# Patient Record
Sex: Female | Born: 1990 | Race: White | Hispanic: No | Marital: Single | Smoking: Never smoker
Health system: Southern US, Community
[De-identification: ages and names within clinical notes are randomized; demographics above are authoritative.]

## PROBLEM LIST (undated history)

## (undated) DIAGNOSIS — R6250 Unspecified lack of expected normal physiological development in childhood: Secondary | ICD-10-CM

## (undated) DIAGNOSIS — Z8669 Personal history of other diseases of the nervous system and sense organs: Secondary | ICD-10-CM

## (undated) DIAGNOSIS — R519 Headache, unspecified: Secondary | ICD-10-CM

## (undated) DIAGNOSIS — R42 Dizziness and giddiness: Secondary | ICD-10-CM

## (undated) DIAGNOSIS — H9190 Unspecified hearing loss, unspecified ear: Secondary | ICD-10-CM

## (undated) DIAGNOSIS — J302 Other seasonal allergic rhinitis: Secondary | ICD-10-CM

## (undated) DIAGNOSIS — F419 Anxiety disorder, unspecified: Secondary | ICD-10-CM

## (undated) DIAGNOSIS — F909 Attention-deficit hyperactivity disorder, unspecified type: Secondary | ICD-10-CM

## (undated) DIAGNOSIS — R5600 Simple febrile convulsions: Secondary | ICD-10-CM

## (undated) DIAGNOSIS — R482 Apraxia: Secondary | ICD-10-CM

## (undated) DIAGNOSIS — Z9889 Other specified postprocedural states: Secondary | ICD-10-CM

## (undated) DIAGNOSIS — K219 Gastro-esophageal reflux disease without esophagitis: Secondary | ICD-10-CM

## (undated) DIAGNOSIS — I499 Cardiac arrhythmia, unspecified: Secondary | ICD-10-CM

## (undated) DIAGNOSIS — F79 Unspecified intellectual disabilities: Secondary | ICD-10-CM

## (undated) HISTORY — PX: OTHER SURGICAL HISTORY: SHX169

## (undated) HISTORY — DX: Unspecified hearing loss, unspecified ear: H91.90

---
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
## 2012-03-13 HISTORY — PX: OTHER SURGICAL HISTORY: SHX169

## 2012-04-23 HISTORY — PX: OTHER SURGICAL HISTORY: SHX169

## 2012-05-25 DIAGNOSIS — R488 Other symbolic dysfunctions: Secondary | ICD-10-CM

## 2012-05-25 DIAGNOSIS — R471 Dysarthria and anarthria: Secondary | ICD-10-CM | POA: Insufficient documentation

## 2012-05-25 DIAGNOSIS — F429 Obsessive-compulsive disorder, unspecified: Secondary | ICD-10-CM

## 2012-05-25 DIAGNOSIS — R279 Unspecified lack of coordination: Secondary | ICD-10-CM | POA: Insufficient documentation

## 2012-05-25 DIAGNOSIS — R482 Apraxia: Secondary | ICD-10-CM | POA: Insufficient documentation

## 2012-05-25 DIAGNOSIS — F988 Other specified behavioral and emotional disorders with onset usually occurring in childhood and adolescence: Secondary | ICD-10-CM

## 2012-05-31 ENCOUNTER — Encounter: Payer: Self-pay | Admitting: Pediatrics

## 2012-05-31 ENCOUNTER — Ambulatory Visit (INDEPENDENT_AMBULATORY_CARE_PROVIDER_SITE_OTHER): Payer: Managed Care, Other (non HMO) | Admitting: Pediatrics

## 2012-05-31 VITALS — BP 100/74 | HR 78 | Ht <= 58 in | Wt 112.2 lb

## 2012-05-31 DIAGNOSIS — F988 Other specified behavioral and emotional disorders with onset usually occurring in childhood and adolescence: Secondary | ICD-10-CM

## 2012-05-31 DIAGNOSIS — R279 Unspecified lack of coordination: Secondary | ICD-10-CM

## 2012-05-31 DIAGNOSIS — R482 Apraxia: Secondary | ICD-10-CM

## 2012-05-31 DIAGNOSIS — F70 Mild intellectual disabilities: Secondary | ICD-10-CM

## 2012-05-31 DIAGNOSIS — R488 Other symbolic dysfunctions: Secondary | ICD-10-CM

## 2012-05-31 DIAGNOSIS — R471 Dysarthria and anarthria: Secondary | ICD-10-CM

## 2012-05-31 NOTE — Patient Instructions (Addendum)
It was a pleasure to see you.  I recommended [facility] as a good provider for Mackenzie Knight.  We discussed a polysomnogram to evaluate her sleep.  I would recommend this to evaluate her snoring.  She does not have any obvious airway obstruction.  Because of her dysmenorrhea, she needs to have this evaluated.  This may be evaluated by primary physician.  She may need an obstetrician.  I will be happy to write any letters that are needed as you pursue permanent disability, and guardianship.

## 2012-05-31 NOTE — Progress Notes (Signed)
Patient: Mackenzie Knight MRN: [id number] Sex: female DOB: [birth year]  Provider: Deetta Perla, MD Location of Care: [facility]  Note type: Routine return visit  History of Present Illness: Referral Source: Dr. Angus Seller. Lowe History from: mother, patient and [facility] chart Chief Complaint: OCD/ADD  Mackenzie Knight is a 22 y.o. female who returns for evaluation of cognitive delays, motor apraxia, generalized fatigue, and inability to carry out sustained motor or cognitive tasks for possible permanent disability..  The patient returns today for the first time since December 13, 2009.  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  She has been followed by me since February 14, 1993, when she was less than six months of age.  At that time, she had significant problems with swallowing, failure to thrive, and fine and gross motor delay.  She is here today because her mother wants to file for permanent disability for her daughter.  She believes that the patient is not employable because of her cognitive limitations, fine and gross motor weakness, and her decreased stamina.  At 68, she also was unable to care for herself other than health skills.  She is not capable of gainful employment that would allow her to support herself, nor is she able to manage money.  She is not a guardian at this time.  Her mother has significant health problems.  This is further isolated [name], because they are not able to get out as much.  When Mackenzie Knight finished high school, there was no plan and limited opportunity for activities outside the home.  She basically sits at home and spends time on the Internet.  Unfortunately, she is very blunt with comments that she makes in social media, and this has created a number of problems.  She has obsessive and rigid traits when she gets an idea in her  head, she does not let go of it.  Her mother is able to be firm with her and does not always allow her, her way.  Her father has more difficulty.  She has nighttime rituals, which are helpful to get her to bed.  She is eating more independently, but has to eat things in a particular order.  Fortunately, these are not problematic.  She has multiple arousals at nighttime and never gets quality sleep.  Consequently, she is often tired.  Mother tells me that she snores.  She is not able to tell me if she has issues with apnea.  In the past, she had problems with severe headaches that seem to be caused by stress at home and at school.  She has had migrainous qualities.  Mother did not mention headaches as problem today.  The patient has about two weeks with premenstrual symptoms in a four day period.  The length of the cycles is 35 to 40 days.  In addition, given that she is 77, she is no longer seeing her pediatrician Dr. Gardiner Rhyme.  Mother is trying to find a primary physician who can provide care for her daughter.  Since her last visit, she had reconstruction surgery in her right ear drum and there is packing in the ear.  The patient has significant cognitive impairments.  She has a head that seems large for her very petite body.  EEG at 57 months of age was normal.  She has not had further neuroimaging nor a genetics workup.  Review of Systems: 12 system  review was remarkable for weight gain, fatigue, swelling in legs, hearing loss, itching, moles, constipation, easy bruising, joint pain, skin sensitivity, frequent infections, memory loss, confusion, weakness, dizziness, depression, anxiety, not enough sleep, decreased energy, insomnia, sleepiness and snoring. She has not gained weight in 2 years since I saw her.  She has chronic hearing loss from her multiple episodes of otitis media her skin itches because of eczema.  Her bowel movements are hard to occur daily.  She has pain in her left shoulder.  She  has issues with snoring and possibly apnea.  She is often lightheaded and has experienced near syncope.  Her mother thinks that she is depressed and anxious: this has not been diagnosed.  She is a restless sleeper and does not sleep for more than a few hours at a time.  Past Medical History  Diagnosis Date  . Hearing loss    Hospitalizations: yes, Head Injury: yes, Nervous System Infections: no, Immunizations up to date: yes Past Medical History Comments: Patient was hospitalized due to virus when she was 22 years old.  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 Dr. Rema Fendt at [facility] wants to schedual eardrum reconstuction surgery, she has had more hearing loss recently in the right ear.  Her highest developmental quotient as a toddler was 47. When last tested before age 59 it was 3. In March 1996 the patient had episodes of unresponsive staring. EEG at 18 months was normal in the waking state. She was evaluated by Dr. Ramonita Lab, child neurologist at [facility]. 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 No further workup was recommended. She has not had any imaging studies of her brain nor chromosomal studies.  She was given growth hormone at [facility] in an attempt to help her growth. Once off growth hormone she gained 8 pounds in 3 months. She had an irregular menstrual cycle.  She has had extensive physical occupational and speech therapy since she was a toddler. She has a prominent nose, slightly receding chin line, and short stature. As a young child she had severe dysarthria to the point of being unintelligible.  She had problems with mood, anxiety, difficulty sleeping, constipation, environmental allergies, and acne.  Surgical History Past Surgical History  Procedure Laterality Date  . Ear surgeries     Surgical History Comments: Surgery on  right ear to repair her eardrum April 23, 2012.  Family History family history includes Breast cancer in her maternal grandmother; Lupus in her mother; Other in her mother; and Scleroderma in her mother.  Patient's mother has autoimmune disease, symptoms of Lupus, Scleroderma Sjogrens, possible fibromyalgia.  She has recurrent bleeding in stomach and is undergoing treatment at [facility].  They feel that this disease is hereditary. (GAVE disease) She also has low body temp and fatigue. Family History is negative migraines, seizures, cognitive impairment, blindness, deafness, birth defects, chromosomal disorder, autism.  Social History History   Social History  . Marital Status: Single    Spouse Name: N/A    Number of Children: N/A  . Years of Education: N/A   Social History Main Topics  . Smoking status: Never Smoker   . Smokeless tobacco: Never Used  . Alcohol Use: No  . Drug Use: No  . Sexually Active: No   Other Topics Concern  . None   Social History Narrative  . None  Educational level junior college School Attending: [employer]   Living with mother  Hobbies/Interest: internet and texting School comments Aryel is taking a few classes at [employer]  No current outpatient prescriptions on file prior to visit.   No current facility-administered medications on file prior to visit.   The medication list was reviewed and reconciled. All changes or newly prescribed medications were explained.  A complete medication list was provided to the patient/caregiver.  Allergies  Allergen Reactions  . Sulfa Antibiotics    Physical Exam BP 100/74  Pulse 78  Ht 4' 8.75" (1.441 m)  Wt 112 lb 3.2 oz (50.894 kg)  BMI 24.51 kg/m2  General: alert, well developed, well nourished,  short stature in no acute distress  right-handed, blond hair, blue eyes Head: normocephalic out of proportion to body size, She has a prominent nose, slightly receding chin line Ears, Nose and Throat: Otoscopic: right  tympanic membrane was packed and not visible. The left has a scar from myringotomy tube placement .  Pharynx: oropharynx is pink without exudates or tonsillar hypertrophy. Neck: supple, full range of motion, no cranial or cervical bruits Respiratory: auscultation clear Cardiovascular: no murmurs, pulses are normal Musculoskeletal: no skeletal deformities or apparent scoliosis, she has ligamentous laxity in her large joints Skin: no rashes or neurocutaneous lesions  Neurologic Exam  Mental Status: alert; oriented to person, place, and year; knowledge is normal for age; language is normal; she has dysarthria but is intelligible Cranial Nerves: visual fields are full to double simultaneous stimuli; extraocular movements are full, I think that her right eye wanders; pupils are round reactive to light; funduscopic examination shows sharp disc margins with normal vessels; symmetric facial strength; midline tongue and uvula; air conduction is greater than bone conduction bilaterally. Motor: Normal strength, tone, and mass in her legs and; her weakness is in her wrists and intrinsic muscles in the hands 4/5   acceptable fine motor movements; no pronator drift. Sensory: intact responses to cold, vibration, proprioception and stereognosis  Coordination: good finger-to-nose, rapid repetitive alternating movements and finger apposition   Gait and Station:  slightly broad-based gait and station; patient is able to walk on heels, toes and tandem with mild difficulty; balance is  low normal; Romberg exam is negative; Gower response is negative Reflexes: symmetric and diminished bilaterally; no clonus; bilateral flexor plantar responses.  Assessment 1. Mild cognitive impairment 317. 2. Lack of coordination 781.3. 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 4. Dysarthria 784.5. 5. 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  Plan I told mother that I would help any way I could with her plans.  I strongly urged her to meet  with a lawyer to obtain guardianship over the patient.  We discussed various programs that exist throughout the city for young adults who we need to find out where to go for education and social activities.  I recommended that she speak with Barb Merino, RN who has links to the association for retarded citizens.   I do not think that she needs medical treatment at this time.  I am not certain that a detailed genetic and neurologic workup will be useful.  She has a nonfocal examination.  She could very well have some form of deletion that would be found on chromosomal microarray.  This would help explain her condition.  It would not change situation.  I spent 45 minutes of face-to-face time with mother and daughter, more than half of it in consultation.  Deetta Perla MD

## 2012-08-24 DIAGNOSIS — Z0289 Encounter for other administrative examinations: Secondary | ICD-10-CM

## 2012-09-01 DIAGNOSIS — H905 Unspecified sensorineural hearing loss: Secondary | ICD-10-CM | POA: Insufficient documentation

## 2012-11-01 ENCOUNTER — Encounter: Payer: Self-pay | Admitting: Podiatry

## 2012-11-01 ENCOUNTER — Ambulatory Visit (INDEPENDENT_AMBULATORY_CARE_PROVIDER_SITE_OTHER): Payer: Managed Care, Other (non HMO) | Admitting: Podiatry

## 2012-11-01 ENCOUNTER — Telehealth: Payer: Self-pay | Admitting: *Deleted

## 2012-11-01 VITALS — BP 102/72 | HR 77 | Resp 16 | Ht <= 58 in | Wt 114.0 lb

## 2012-11-01 DIAGNOSIS — L600 Ingrowing nail: Secondary | ICD-10-CM

## 2012-11-01 MED ORDER — HYDROCODONE-ACETAMINOPHEN 5-325 MG PO TABS: ORAL_TABLET | ORAL | Status: DC

## 2012-11-01 NOTE — Progress Notes (Signed)
Patient ID: Mackenzie Knight, female   DOB: [birth year], 22 y.o.   MRN: [id number] Subjective: This patient presents with father complaining of pain along the lateral margin of the left hallux toenail with a previous history of matricectomy x3. In the past several weeks her some history of drainage from the site.  Objective: The lateral margin of left hallux toenail is incurvated with slight erythema and edema and recurrence of the ingrowing lateral margin.  Assessment: Recurrence ingrowing lateral margin of left hallux toenail.  Plan: Offered father and patient revisional phenol matricectomy to the lateral margin of left hallux toenail. He verbally consented to the procedure. The left hallux was then blocked with 3 cc of 50-50 mixture of 2% plain Xylocaine and 0.5% plain Marcaine. The left hallux was painted with Betadine and exsanguinated. The lateral margin of left hallux toenail was excised and a phenol matricectomy was performed to the lateral margin of the left hallux toenail. An antibiotic compression dressing was applied. The tourniquet was released and spontaneous capillary filling times and noted to the digit of the left foot.  Postoperative oral and written instructions include Dilantin bacterial soft soaks, topical antibiotic ointment and a 1H Band-Aid to the wound site until healed. After discharge father requested pain medication. Rx'd Vicodin 5/325 #10 SIg: 1 by mouth every 6 hours when necessary pain.  Reappoint at the request of patient or parent.  Richard C.Leeanne Deed, DPM

## 2012-11-01 NOTE — Telephone Encounter (Signed)
Pt's ftr states they were unable to find the Vidodin from last visit, can Dr Leeanne Deed write another rx for about 8 or 10. Also how long will the numbness last and can she take a shower?  Dr Leeanne Deed ordered Vicodin 5/325 mg #10 1 tablet q 6 to 8 hrs, orders printed for pt's ftr to pick-up.  I informed pt's ftr that the numbness could last 4 to 6 hrs, and she could shower if she kept the toe dry until tomorrow's shower.

## 2012-11-01 NOTE — Patient Instructions (Addendum)
ANTIBACTERIAL SOAP INSTRUCTIONS  THE DAY AFTER PROCEDURE  Please follow the instructions your doctor has marked.   Shower as usual. Before getting out, place a drop of antibacterial liquid soap (Dial) on a wet, clean washcloth.  Gently wipe washcloth over affected area.  Afterward, rinse the area with warm water.  Blot the area dry with a soft cloth and cover with antibiotic ointment (neosporin, polysporin, bacitracin) and band aid or gauze and tape  Place 3-4 drops of antibacterial liquid soap in a quart of warm tap water.  Submerge foot into water for 20 minutes.  If bandage was applied after your procedure, leave on to allow for easy lift off, then remove and continue with soak for the remaining time.  Next, blot area dry with a soft cloth and cover with a bandage.  Apply other medications as directed by your doctor, such as cortisporin otic solution (eardrops) or neosporin antibiotic ointment     No PE this week. ( Week of 11/01/12)

## 2012-11-15 ENCOUNTER — Ambulatory Visit (INDEPENDENT_AMBULATORY_CARE_PROVIDER_SITE_OTHER): Payer: Managed Care, Other (non HMO) | Admitting: Podiatry

## 2012-11-15 ENCOUNTER — Encounter: Payer: Self-pay | Admitting: Podiatry

## 2012-11-15 VITALS — BP 111/73 | HR 85 | Resp 16 | Ht <= 58 in | Wt 114.0 lb

## 2012-11-15 DIAGNOSIS — L03039 Cellulitis of unspecified toe: Secondary | ICD-10-CM

## 2012-11-15 MED ORDER — CEPHALEXIN 500 MG PO CAPS: 500.0000 mg | ORAL_CAPSULE | Freq: Three times a day (TID) | ORAL | Status: DC

## 2012-11-15 NOTE — Patient Instructions (Signed)
Complete 7 days of cephalexin and if the toenail is not improved return for nail surgery.

## 2012-11-15 NOTE — Progress Notes (Signed)
  Subjective:    Patient ID: Mackenzie Knight, female    DOB: Jan 27, 1990, 22 y.o.   MRN: [id number] "It's the other side of that toe now."  She is complaining of pain along the medial border of the left hallux toenail. The lateral margin of the left hallux toenail was removed for permanent correction several times.   HPI Comments: N Throbbing  L  Ingrown Hallux left medial border D  Wednesday O  Suddenly C  About the same A  Pressure from my tennis shoe T  bandaid      Review of Systems     Objective:   Physical Exam 22 year old white female presents with her father.  Dermatological: The medial border of the left hallux toenail demonstrates mild incurvation and low-grade erythema slight edema no drainage. The lateral margin of the left hallux toenail was removed from previous phenol matricectomy.       Assessment & Plan:  Assessment: Low-grade paronychia medial border left hallux toenail  Plan: Cephalexin 500 mg by mouth 3 times a day x7 days prescribed. Patient and father advised that after completion of antibiotic if symptoms do not improve would excise the medial margin of the left hallux nail. Reappoint when necessary.  Richard C.Leeanne Deed, DPM

## 2012-11-23 ENCOUNTER — Telehealth: Payer: Self-pay | Admitting: *Deleted

## 2012-11-23 NOTE — Telephone Encounter (Signed)
Pt's ftr states that pt's antibiotic runs out today and 1st available appt is on 11/29/2012.  [name]'s nail area is still red and tender.  Does she need another refill?  I instructed pt's ftr to begin either Epsom salt, or betadine, white vinegar soaks and change antibiotic ointment to Bacitracin ointment only.  I would advise Dr Leeanne Deed of the situation and call again.

## 2012-11-24 NOTE — Telephone Encounter (Signed)
Dr Leeanne Deed ordered continue soaks and make an appt for toenail procedure.  Orders to pt's ftr and transferred to scheduler.

## 2012-11-24 NOTE — Telephone Encounter (Signed)
Maintain local soaking at this time. Schedule appointment to have the medial margin of the nail removed permanently.

## 2012-11-29 ENCOUNTER — Encounter: Payer: Self-pay | Admitting: Podiatry

## 2012-11-29 ENCOUNTER — Ambulatory Visit (INDEPENDENT_AMBULATORY_CARE_PROVIDER_SITE_OTHER): Payer: Managed Care, Other (non HMO) | Admitting: Podiatry

## 2012-11-29 VITALS — BP 111/76 | HR 87 | Resp 18

## 2012-11-29 DIAGNOSIS — L600 Ingrowing nail: Secondary | ICD-10-CM

## 2012-11-29 NOTE — Patient Instructions (Signed)

## 2012-11-29 NOTE — Progress Notes (Signed)
  Subjective:    Patient ID: Mackenzie Knight, female    DOB: August 25, 1990, 22 y.o.   MRN: [id number]  HPI ingrown toenail on left and is still sore and tender and no draining and hurts to wear shoes and used neosporin and did soak it  This patient presents with her father after completing a dose of cephalexin without any resolution of symptoms in the left medial margin of the hallux.  Review of Systems     Objective:   Physical Exam The medial margin of the left hallux is incurvated with low-grade edema and erythema, no drainage noted.       Assessment & Plan:   Assessment: Ingrowing medial margin of the left hallux toenail with low-grade paronychia.  Plan: Offered father and patient phenol matricectomy to the medial margin of the left hallux toenail. The father and patient verbally except this procedure. The left hallux was then blocked with 3 cc of 50-50 mixture of 2% plain Xylocaine and 0.5% plain Marcaine. The left hallux is prepped with Betadine and exsanguinated. The medial border of the left hallux toenail was excised and a phenol matricectomy was performed of the medial border of the left hallux toenail. An antibiotic compression dressing was applied. The tourniquet was released and spontaneous capillary filling time noted to the left hallux. Patient will begin antibacterial soft soaks and application topical antibiotic ointment to wound site daily until healed.  Reappoint at patient's request.

## 2012-11-30 IMAGING — CT CT TEMPORAL BONES W/O CM
4 of 6 series · 18 of 30 positions shown, 19 images · non-contrast
Comparison: None.

CLINICAL DATA: Acute serous otitis  media on the right.

CT TEMPORAL BONES WITHOUT CONTRAST
TECHNIQUE: Axial and coronal plane CT imaging of the petrous
temporal bones was performed with thin-collimation image
reconstruction.  No intravenous contrast was administered.
Multiplanar CT image reconstructions were also generated.

[Series 3: ax mag right · axial · 0.20mm/px · z∈[+117,+145]mm · 3 of 179 slices shown]
[im 45/179  bone]
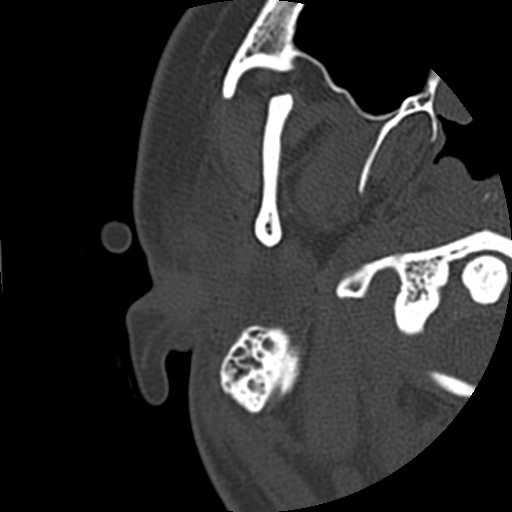
[im 90/179  bone]
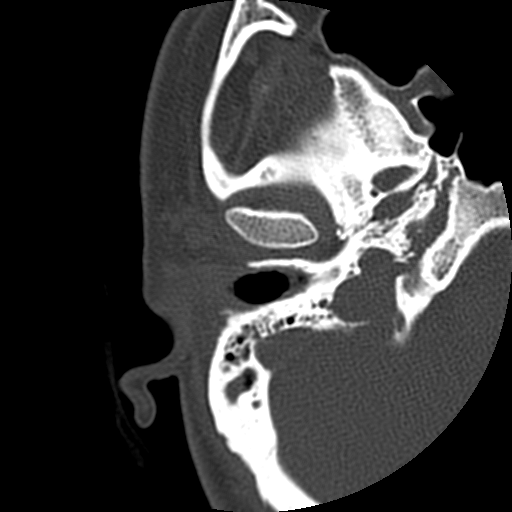
[im 134/179  bone]
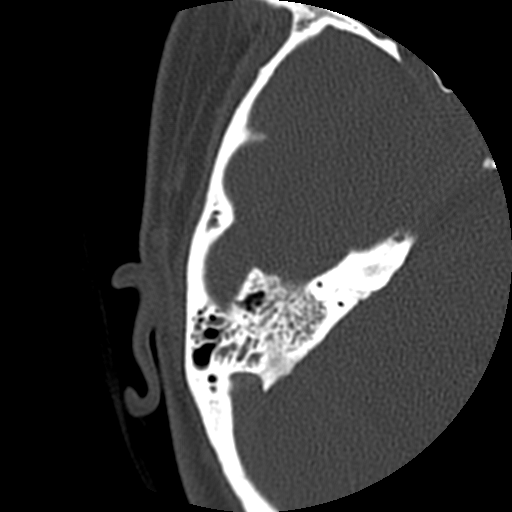

[Series 200: cor · axial · 0.33mm/px · z∈[+105,+216]mm · 3 of 95 slices shown, 4 images]
[im 1/95  brain]
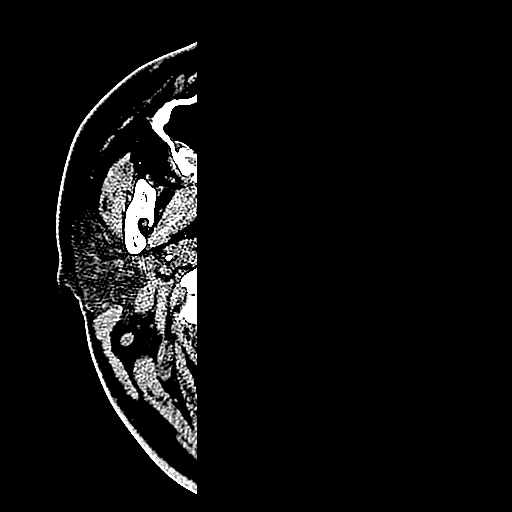
[im 1/95  bone]
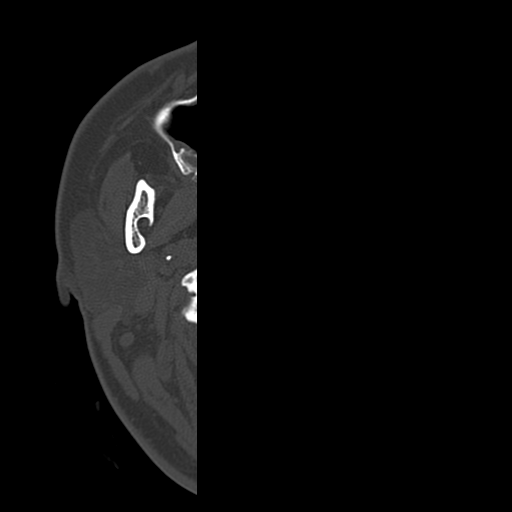
[im 48/95  bone]
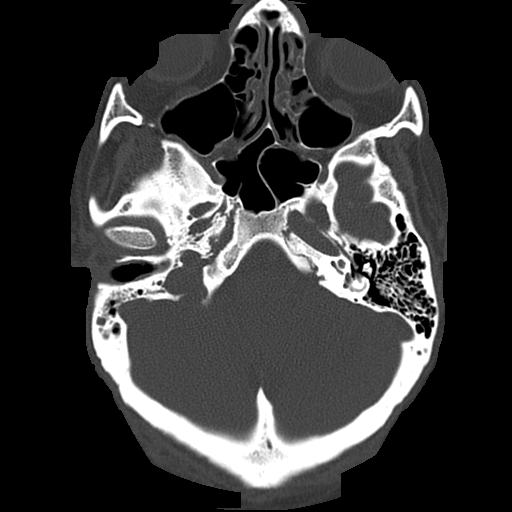
[im 95/95  bone]
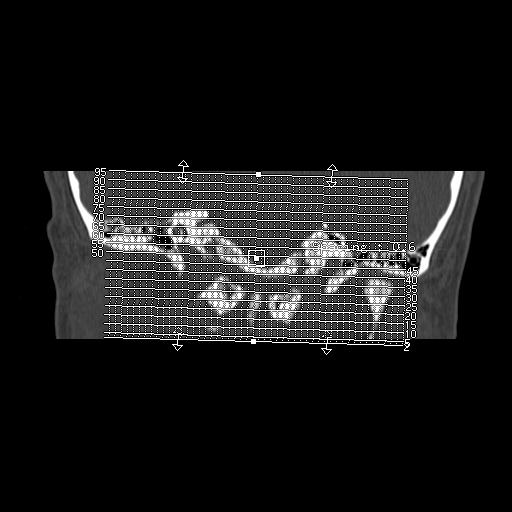

[Series 300: rt cor · coronal · 0.20mm/px · 6 of 261 slices shown]
[im 38/261  bone]
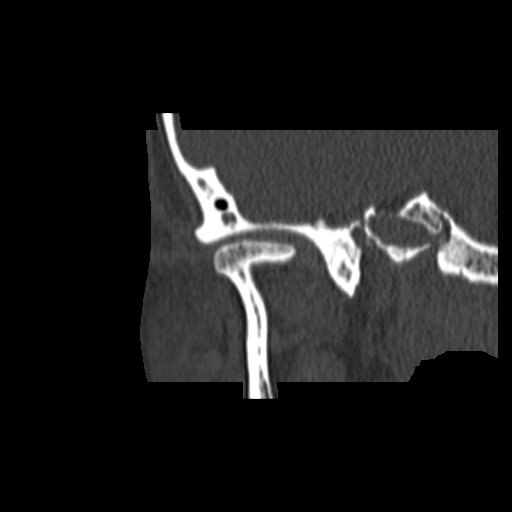
[im 75/261  bone]
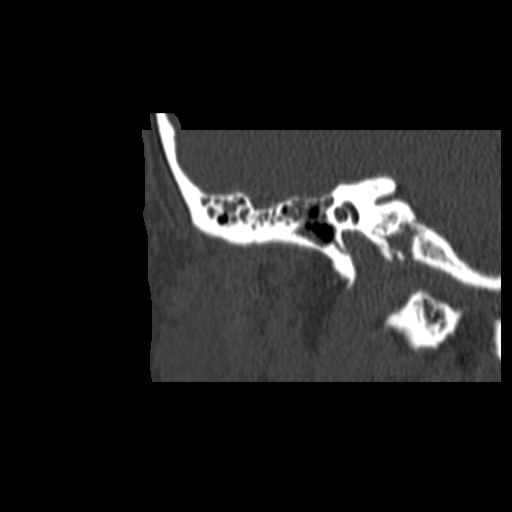
[im 112/261  bone]
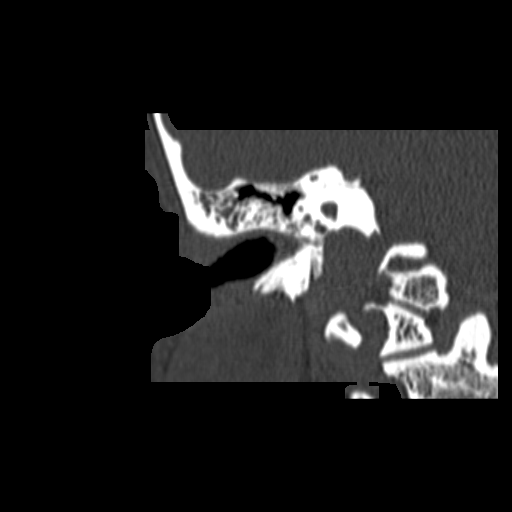
[im 149/261  bone]
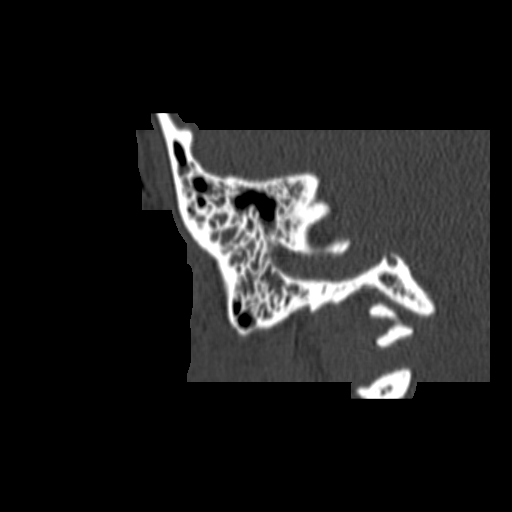
[im 186/261  bone]
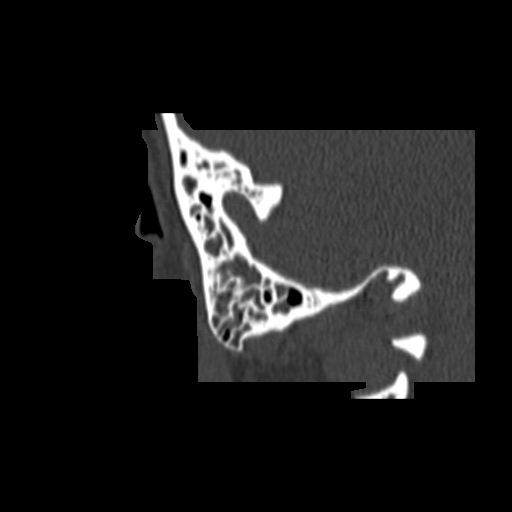
[im 223/261  bone]
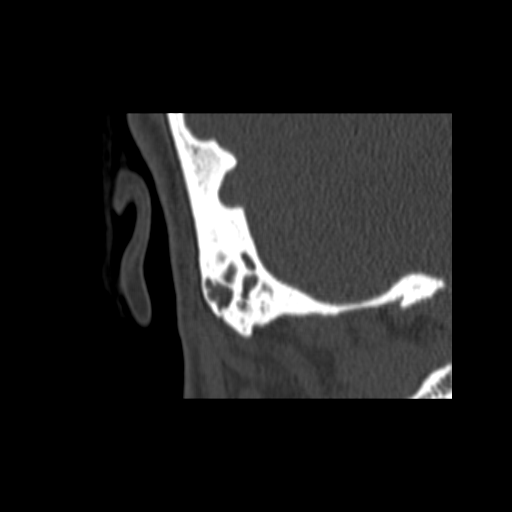

[Series 400: lt cor · coronal · 0.20mm/px · 6 of 286 slices shown]
[im 41/286  bone]
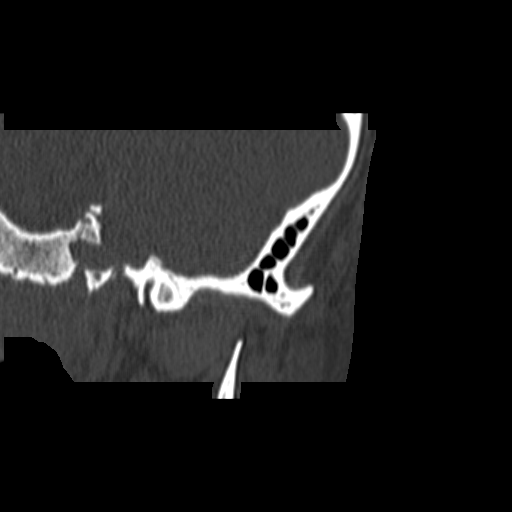
[im 82/286  bone]
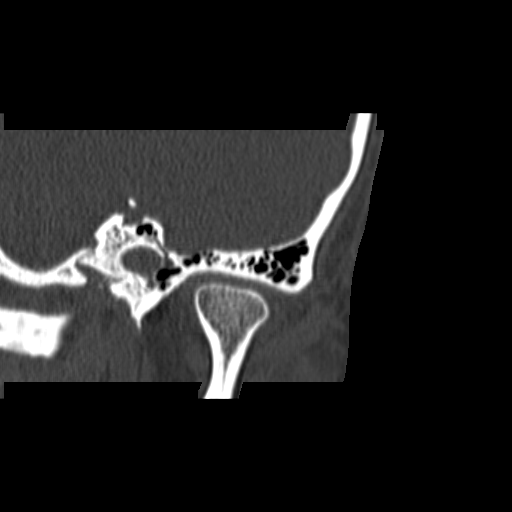
[im 123/286  bone]
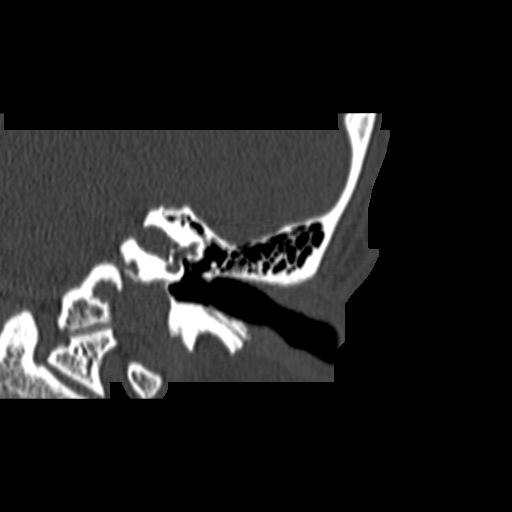
[im 163/286  bone]
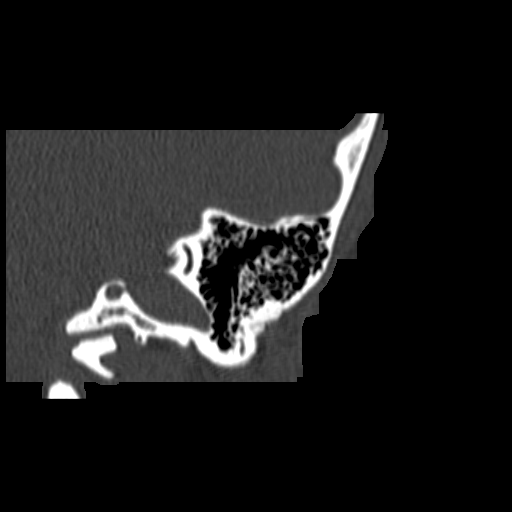
[im 204/286  bone]
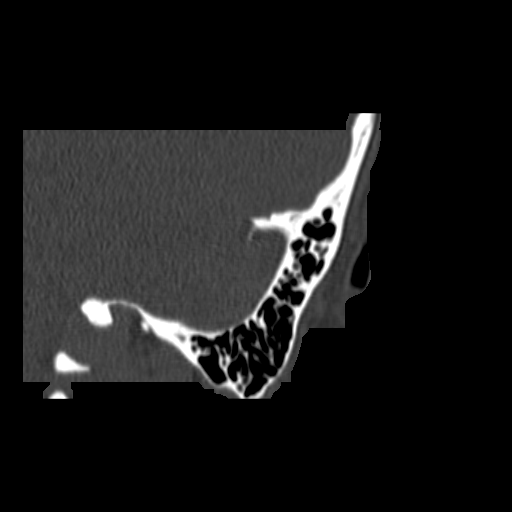
[im 245/286  bone]
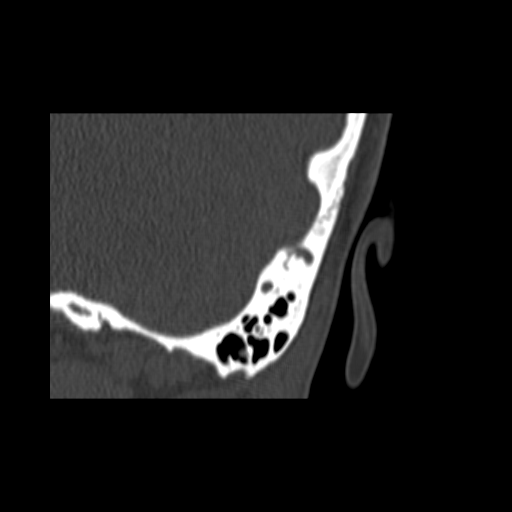

[18 of 30 positions shown; findings below may reference images not displayed]

FINDINGS: Mucosal edema is present in the paranasal sinuses without
air-fluid level.  Visualized orbit is normal.  No acute abnormality
in the visualized brain.

Right temporal bone:  Right mastoid sinus effusion with air-fluid
levels and opacification of multiple air cells.  No coalescence.
No bony destruction.  No evidence of cholesteatoma.  There is
mucosal edema/fluid in the middle ear lateral to the ossicles
without erosion of the scutum.  There is mild thickening of the
tympanic membrane and there is thickening of the external auditory
canal skin.  Inner ear structures are normal.  Internal auditory
canals normal.  Vestibular aqueduct is normal.

Left temporal bone:  Normal mastoid sinus and middle ear.  Ossicles
are normal.  No effusion or bony destruction.  Inner ear structures
are normal.
IMPRESSION: Right mastoid sinus effusion as well as fluid in the middle ear.
No evidence of cholesteatoma or mass.  There is thickening of the
external canal and tympanic membrane.

Chronic sinusitis in the paranasal sinuses.

## 2012-12-29 ENCOUNTER — Ambulatory Visit (INDEPENDENT_AMBULATORY_CARE_PROVIDER_SITE_OTHER): Payer: Managed Care, Other (non HMO) | Admitting: Podiatry

## 2012-12-29 ENCOUNTER — Encounter: Payer: Self-pay | Admitting: Podiatry

## 2012-12-29 VITALS — BP 99/63 | HR 73 | Resp 16 | Ht <= 58 in | Wt 114.0 lb

## 2012-12-29 DIAGNOSIS — L600 Ingrowing nail: Secondary | ICD-10-CM

## 2012-12-29 NOTE — Progress Notes (Signed)
Patient ID: Mackenzie Knight, female   DOB: 03-03-1990, 22 y.o.   MRN: [id number]  Subjective: Patient presents with father who is requesting evaluation of surgical site on the medial margin of the left hallux toenail after phenol matricectomy performed on 11/29/2012.  Objective: Narrowing of the medial lateral borders the left hallux toenail noted. Evidence of dried blood in the posterior medial nail fold noted. No erythema edema or active drainage noted.  Assessment: Satisfactory appearance of operative site, medial border left hallux toenail without any clinical sign of infection or recurrence.  Plan: Patient advised to continue applied topical antibiotic ointment and cover with a Band-Aid on a daily basis. Reappoint at patient's request.

## 2013-01-17 ENCOUNTER — Ambulatory Visit (INDEPENDENT_AMBULATORY_CARE_PROVIDER_SITE_OTHER): Payer: BC Managed Care – PPO | Admitting: Podiatry

## 2013-01-17 ENCOUNTER — Encounter: Payer: Self-pay | Admitting: Podiatry

## 2013-01-17 VITALS — BP 113/69 | Resp 16 | Ht <= 58 in | Wt 114.0 lb

## 2013-01-17 DIAGNOSIS — L600 Ingrowing nail: Secondary | ICD-10-CM

## 2013-01-17 NOTE — Patient Instructions (Signed)

## 2013-01-18 NOTE — Progress Notes (Signed)
Patient ID: Mackenzie Knight, female   DOB: 02-15-1990, 23 y.o.   MRN: [id number]  Subjective: Patient presents with her father again complaining of pain and discomfort along the medial margin of the left hallux toenail. A phenol matricectomy was performed on 11/29/2012. Oral antibiotics and topical medication were used postoperatively,however, the symptoms continued. The patient has history of repetitive chemical matricectomy  to the right and left hallux nails with recurrence of margins.  Objective: 23 year old white female appears orientated x3  Vascular: DP and PT pulses are two over four bilaterally  Dermatological: Medial margin of the left hallux toenail demonstrates granulation tissue at the posterior medial nail fold and some residual nail.  Assessment: Recurrence of medial margin of the left hallux toenail with low-grade paronychia  Plan: Offered patient and father revisional phenol matricectomy. Patient father verbally consent. The left hallux is then blocked with 3 cc of 50-50 mixture of 2% plain Xylocaine and 0.5% plain Marcaine. The toe is painted with Betadine and exsanguinated. The medial margin of the left hallux toenail was excised and a phenol matricectomy was performed. The tourniquet was released and spontaneous capillary fill time was noted in the left hallux. An antibiotic compression dressing was applied. Postoperative written instructions provided. Reappoint x7 days

## 2013-01-24 ENCOUNTER — Ambulatory Visit: Payer: BC Managed Care – PPO | Admitting: Podiatry

## 2013-01-27 ENCOUNTER — Ambulatory Visit (INDEPENDENT_AMBULATORY_CARE_PROVIDER_SITE_OTHER): Payer: BC Managed Care – PPO | Admitting: Podiatry

## 2013-01-27 ENCOUNTER — Encounter: Payer: Self-pay | Admitting: Podiatry

## 2013-01-27 VITALS — BP 111/74 | HR 84 | Resp 14

## 2013-01-27 DIAGNOSIS — L600 Ingrowing nail: Secondary | ICD-10-CM

## 2013-01-27 NOTE — Progress Notes (Signed)
Patient ID: Mackenzie Knight, female   DOB: 11-12-1990, 23 y.o.   MRN: [id number]  Subjective: Patient presents with her father for revisional postop phenol matricectomy performed in 01/18/2013 on the left hallux.  Objective: The margins the left hallux nail are narrowed, crusted without any active drainage. No erythema noted  Assessment: Satisfactory postoperative progress without any clinical sign of infection  Plan: Continue to apply topical antibiotic ointment and Band-Aid to the left hallux nail until all sensitivity and crusting is resolved. Reappoint at patient's request.

## 2013-04-15 ENCOUNTER — Telehealth: Payer: Self-pay | Admitting: *Deleted

## 2013-04-15 NOTE — Telephone Encounter (Signed)
Vaughan Basta the patient's mom called and is wanting a recommendation for a new PCP that's within the [facility] network for the patient and she would like to know who Dr. Gaynell Face would recommend that would be great for the needs that Jull has. Mom can be reached at [phone].     Thanks,  Meredeth Ide.

## 2013-04-15 NOTE — Telephone Encounter (Signed)
I returned a phone call and left a detailed message recommending [facility] and the reasons for that.  I invited mother to call back.

## 2013-04-19 ENCOUNTER — Telehealth: Payer: Self-pay

## 2013-04-19 NOTE — Telephone Encounter (Signed)
I called and talked to Mom. I gave her suggestions of [facility] or [facility] or Internal Medicine. I told her that we typically recommend [facility] for a person of [name]'s age. I also told her to let me know if she needs me to send neurology records. TG

## 2013-04-19 NOTE — Telephone Encounter (Signed)
Linda, mom, lvm stating that she got Dr. Lemmie Evens vm last week about recommendation for PCP. She said that child was seen by [facility] yesterday, 04/18/13, and they recommended that pt be evaluated by a cardiologist. She wants to know who Dr. Lemmie Evens suggests? She said that pt is having other symptoms that she would like to discuss with Dr.H.  I called mom and informed her that Dr.H is out of the office today and that he will call when he returns. I asked her about the symptoms that she mentioned pt having. She said that pt has been dizzy for a few weeks. She was evaluated 04/18/13 by ENT at [facility], Dr. Owens Shark. He has taken the place of the doctor that did Environmental health practitioner. She said that Dr. Owens Shark did some testing and does not think the cause of the dizziness is vertigo or ear related. He suggested that it may be heart related and said that she should be evaluated by a cardiologist. Vaughan Basta can be reached at [phone].

## 2013-04-19 NOTE — Telephone Encounter (Signed)
Mom called back and lvm on Tina's vm after I spoke with her. She said that the PCP that Dr.H reccommended has a 60 day waiting list and that Dr. Owens Shark wanted her seen by a PCP and a Cardiologist ASAP. She said that pt has seen Dr.Melissa Corinna Capra, pediatrician, in the past but that Dr.Brown does not think this is something that she should be seen by a pediatrician for. She asked that [name] call her back at [phone].

## 2013-07-20 ENCOUNTER — Ambulatory Visit (INDEPENDENT_AMBULATORY_CARE_PROVIDER_SITE_OTHER): Payer: BC Managed Care – PPO | Admitting: Pediatrics

## 2013-07-20 ENCOUNTER — Encounter: Payer: Self-pay | Admitting: Pediatrics

## 2013-07-20 VITALS — BP 119/77 | HR 96 | Ht <= 58 in | Wt 119.6 lb

## 2013-07-20 DIAGNOSIS — R488 Other symbolic dysfunctions: Secondary | ICD-10-CM

## 2013-07-20 DIAGNOSIS — R42 Dizziness and giddiness: Secondary | ICD-10-CM

## 2013-07-20 DIAGNOSIS — H902 Conductive hearing loss, unspecified: Secondary | ICD-10-CM

## 2013-07-20 DIAGNOSIS — R471 Dysarthria and anarthria: Secondary | ICD-10-CM

## 2013-07-20 DIAGNOSIS — R279 Unspecified lack of coordination: Secondary | ICD-10-CM
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
## 2014-06-26 ENCOUNTER — Telehealth: Payer: Self-pay | Admitting: Family

## 2014-06-26 NOTE — Telephone Encounter (Signed)
Mackenzie Knight has an adult female friend who also has intellectual disabilities parents are wondering how best to approach this situation.  They are not ready to allow this relationship to progress beyond its current stage.  Mackenzie Knight is having some difficulty emotionally dealing with it.  I suggested Dr. Delmer Islam, although I don't know if she will see an adult.

## 2014-06-26 NOTE — Telephone Encounter (Signed)
Dad Mackenzie Knight has questions about [name] and wants Dr Gaynell Face to call him at [phone]. TG

## 2014-08-09 ENCOUNTER — Ambulatory Visit (INDEPENDENT_AMBULATORY_CARE_PROVIDER_SITE_OTHER): Payer: BLUE CROSS/BLUE SHIELD | Admitting: Pediatrics

## 2014-08-09 ENCOUNTER — Encounter: Payer: Self-pay | Admitting: Pediatrics

## 2014-08-09 VITALS — BP 100/58 | HR 76 | Ht <= 58 in | Wt 118.8 lb

## 2014-08-09 DIAGNOSIS — H905 Unspecified sensorineural hearing loss: Secondary | ICD-10-CM | POA: Diagnosis not present

## 2014-08-09 DIAGNOSIS — F4325 Adjustment disorder with mixed disturbance of emotions and conduct: Secondary | ICD-10-CM

## 2014-08-09 DIAGNOSIS — F70 Mild intellectual disabilities: Secondary | ICD-10-CM | POA: Diagnosis not present

## 2014-08-09 DIAGNOSIS — R5383 Other fatigue: Secondary | ICD-10-CM | POA: Insufficient documentation

## 2014-08-09 DIAGNOSIS — R5382 Chronic fatigue, unspecified: Secondary | ICD-10-CM

## 2014-08-09 DIAGNOSIS — R471 Dysarthria and anarthria: Secondary | ICD-10-CM

## 2014-08-09 DIAGNOSIS — R482 Apraxia: Secondary | ICD-10-CM

## 2014-08-09 DIAGNOSIS — G44219 Episodic tension-type headache, not intractable: Secondary | ICD-10-CM | POA: Diagnosis not present

## 2014-08-09 NOTE — Progress Notes (Signed)
Patient: Mackenzie Knight MRN: [id number] Sex: female DOB: 1990-08-17  Provider: Jodi Geralds, MD Location of Care: [facility]  Note type: Routine return visit  History of Present Illness: Referral Source: Dr. Lennie Hummer  History from: mother, patient and [facility] chart Chief Complaint: Mild Intellectual Disability/ADD  Mackenzie Knight is a 24 y.o. female who returns on August 09, 2014, for the first time since July 20, 2013.  She has intellectual disability, motor apraxia, generalized fatigue, and inability to carry out sustained motor or cognitive tasks.  Her mother's main concern today is that Mackenzie Knight becomes extremely tired and needs to sleep more than is normal.  The other major concern is that Mackenzie Knight is very aware of her situation living at home with her mother.    She graduated from high school.  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  She spends time with friends who do not have disabilities, but for the most part is at home.  She wants to have a boyfriend who is not disabled and is having depression and anxiety with her inability to realize that dream.  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  She helps with chores around the home.  She has friends with whom she communicates on the Internet.  Unfortunately, at times her anger and impulsivity generate texts that are mean and hurtful.  Her counselor is working with her.  Mother wondered whether or not the medication would help her become less impulsive, in situations like that.  Unfortunately, from time-to-time she speaks to her mother that way as well.  I do not know whether this represents underlying anger and frustration, anxiety, or simply unfiltered impulsive behavior.  Mackenzie Knight needs some help with activities of  daily living.  She can dress and undress herself with a simple shirt and pants, but needs help with fixing her hair.  She can bathe herself for the most part.  She can also take care of her toileting.  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  This was very successful and she enjoyed herself very much.  She goes to bed somewhere between 7 p.m. and 11 p.m. and sleeps until 9 a.m.  The only time she took naps during the day was during the rehearsals leading up to the performances.  She was up quite late and had to get up fairly early.  Her mother also noted that she has headaches after she rides in a tube behind the boat.  Fortunately, these can be treated with over-the-counter medications.  I do not think that they are migraines.  I suspect that buffeting on the water may have something to do with it, but do not know why.  Review of Systems: 12 system review was remarkable for attention span/ADD  Past Medical History Diagnosis Date  . Hearing loss    Hospitalizations: Yes.  , Head Injury: No., Nervous System Infections: No., Immunizations up to date: Yes.    Patient was hospitalized due to virus when she was 24 years old.  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 Dr. Anthoney Harada at [facility] wants to schedual eardrum reconstuction surgery, she has had more  hearing loss recently in the right ear.  Her highest developmental quotient as a toddler was 33. When last tested before age 47 it was 100. In March 1996 the patient had episodes of unresponsive staring. EEG at 18 months was normal in the waking state. She was evaluated by Dr. Ginette Otto, child neurologist at [facility]. 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 No further workup was recommended. She has not had any imaging  studies of her brain nor chromosomal studies.   She was given growth hormone at [facility] in an attempt to help her growth. Once off growth hormone she gained 8 pounds in 3 months. She had an irregular menstrual cycle.  She has had extensive physical occupational and speech therapy since she was a toddler. She has a prominent nose, slightly receding chin line, and short stature. As a young child she had severe dysarthria to the point of being unintelligible.  She had problems with mood, anxiety, difficulty sleeping, constipation, environmental allergies, and acne.  Behavior History problems with mood, anxiety  Surgical History Procedure Laterality Date  . Ear surgeries    . Ingrown toenails Left    Family History family history includes Breast cancer in her maternal grandmother; Lupus in her mother; Other in her mother; Scleroderma in her mother. Family history is negative for migraines, seizures, intellectual disabilities, blindness, deafness, birth defects, chromosomal disorder, or autism.  Social History . Marital Status: Single    Spouse Name: N/A  . Number of Children: N/A  . Years of Education: N/A   Social History Main Topics  . Smoking status: Never Smoker   . Smokeless tobacco: Never Used  . Alcohol Use: No  . Drug Use: No  . Sexual Activity: No   Social History Narrative   Educational level 12th grade    Living with mother   Hobbies/Interest: Enjoys [employer], watching TV and walking the dog.  Allergies Allergen Reactions  . Sulfa Antibiotics Hives   Physical Exam BP 100/58 mmHg  Pulse 76  Ht 4' 8.75" (1.441 m)  Wt 118 lb 12.8 oz (53.887 kg)  BMI 25.95 kg/m2  LMP 08/08/2014 (Exact Date)  General: alert, well developed, well nourished, short stature in no acute distress right-handed,blond hair and blue eyes Head: normocephalic and out of proportion to body size, receding chin line Ears, Nose and Throat: Otoscopic: right tympanic  membrane with some red streaks. Left TM scar, oropharynx is pink without exudates or tonsillar hypertrophy, neck supple, full range of motion, no cranial or cervical bruits  Respiratory: clear to auscultation bilaterally  Cardiovascular: no murmurs, pulses are normal  Musculoskeletal: no skeletal deformities Skin: no rashes or neurocutaneous lesions   Neurologic Exam  Mental Status: alert; oriented to person, place, and year; knowledge is normal for age; difficult to articulate how she feels; she has dysarthria but is intelligible  Cranial Nerves: visual fields are full to double simultaneous stimuli; extraocular movements are full; pupils are round reactive to light; funduscopic examination shows sharp disc margins with normal vessels; symmetric facial strength; midline tongue and uvula; air conduction is greater than bone conduction bilaterally.  Motor: Normal strength, tone, and mass in her legs and; her weakness is in her wrists and intrinsic muscles in the hands 4+/5 acceptable fine motor movements; no pronator drift.  Sensory: intact responses to cold, vibration, proprioception and stereognosis  Coordination: good finger-to-nose, rapid repetitive alternating movements and finger  apposition  Gait and Station: slightly broad-based gait and station; patient is able to walk on heels, toes and tandem with mild difficulty; balance is low normal; Romberg exam is negative; Gower response is negative  Reflexes: symmetric and diminished bilaterally; no clonus; bilateral flexor plantar responses.  Assessment 1. Chronic fatigue, R53.82. 2. 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 3. Dysarthria, R47.1. 4. 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 5. Mild intellectual disability, F70. 6. 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 7. Episodic tension-type headache, not intractable, G44.219.  Discussion I acknowledged the difficulties of Mackenzie Knight's expectations versus the reality.  For all of  her life, her mother has tried to have Mackenzie Knight included with typically developing peers with great success.  Unfortunately, this is changing, and though I think it was the right thing to do throughout her life, it is the source of conflict at this time.  I think that she is doing the right thing by providing counseling for her daughter.  Whether or not that will be sufficient remains to be seen.  I do not think that placing her on medication at this time is in her best interest because I do not think that will change.  It is my hope that Mackenzie Knight can find other activities to fill her day.  If she is able to do so, I think that some of her problems will lessen.  Plan She will return to see me in one year's time.  I spent 30 minutes of face-to-face time with Mackenzie Knight and her mother, more than half of it in consultation.   Medication List   This list is accurate as of: 08/09/14 11:59 PM.       cetirizine 5 MG tablet  Commonly known as:  ZYRTEC  Take 5 mg by mouth.     vitamin C 100 MG tablet  Take 100 mg by mouth daily.      The medication list was reviewed and reconciled. All changes or newly prescribed medications were explained.  A complete medication list was provided to the patient/caregiver.  Jodi Geralds MD

## 2014-08-09 NOTE — Patient Instructions (Addendum)
Impulsive behavior may be a sign of attention deficit disorder.  I will be willing to place her on a long-term neuro stimulant to see how she responds to it and whether it causes side effects that are unacceptable.  Her examination today is normal with the exception of her mild problems with articulation slightly broad-based gait.  I don't know why she fatigues so easily and requires so much sleep.

## 2014-08-10 DIAGNOSIS — F70 Mild intellectual disabilities: Secondary | ICD-10-CM | POA: Insufficient documentation

## 2014-08-10 DIAGNOSIS — F4325 Adjustment disorder with mixed disturbance of emotions and conduct: Secondary | ICD-10-CM | POA: Insufficient documentation

## 2014-08-10 DIAGNOSIS — G44219 Episodic tension-type headache, not intractable: Secondary | ICD-10-CM | POA: Insufficient documentation

## 2014-10-11 ENCOUNTER — Encounter: Payer: Self-pay | Admitting: Podiatry

## 2014-10-11 ENCOUNTER — Ambulatory Visit (INDEPENDENT_AMBULATORY_CARE_PROVIDER_SITE_OTHER): Payer: BLUE CROSS/BLUE SHIELD | Admitting: Podiatry

## 2014-10-11 VITALS — BP 106/69 | HR 80 | Resp 12

## 2014-10-11 DIAGNOSIS — S91209A Unspecified open wound of unspecified toe(s) with damage to nail, initial encounter: Secondary | ICD-10-CM | POA: Diagnosis not present

## 2014-10-11 NOTE — Patient Instructions (Signed)
Apply topical antibiotic ointment daily to the left great toenail bed and cover with a Band-Aid until a scab forms

## 2014-10-11 NOTE — Progress Notes (Signed)
   Subjective:    Patient ID: Mackenzie Knight, female    DOB: 04/25/1990, 24 y.o.   MRN: [id number]  HPI  This patient presents today with her mother in the treatment room complaining that the left hallux toenail is partially lifting off the nail bed, after accidentally hitting the toe against her father shoe while they will walking about a week ago.. The nail is slightly uncomfortable when she wears her shoes and walks. No self treatment was given for this problem. She has a history of matrixectomy to this nail.  Review of Systems  All other systems reviewed and are negative.      Objective:   Physical Exam  Pleasant orientated 3 patient presents with her mother treatment room  Vascular: DP and PT pulses 2/4 bilaterally A reflex immediate bilaterally  Neurological: Deferred  Dermatological: The distal 2/3  of the left hallux nail plate is lifting off the nailbed bed. There is no erythema, edema drainage surrounding the nail plate  Musculoskeletal: No deformities noted, bilaterally     Assessment & Plan:   Assessment: Partial toe traumatic nail avulsion left hallux nail  Plan: The nail was debrided with  slight bleeding. The underlying nail bed looks normal without any lesions. There is no surrounding erythema, edema or drainage I instructed patient to apply topical antibiotic ointment and cover with a Band-Aid daily until a scab forms  Reappoint at patient's request

## 2014-12-12 DIAGNOSIS — H9202 Otalgia, left ear: Secondary | ICD-10-CM | POA: Diagnosis not present

## 2014-12-15 DIAGNOSIS — H9202 Otalgia, left ear: Secondary | ICD-10-CM | POA: Diagnosis not present

## 2015-01-05 DIAGNOSIS — L819 Disorder of pigmentation, unspecified: Secondary | ICD-10-CM | POA: Diagnosis not present

## 2015-01-05 DIAGNOSIS — R197 Diarrhea, unspecified: Secondary | ICD-10-CM | POA: Diagnosis not present

## 2015-01-19 DIAGNOSIS — N946 Dysmenorrhea, unspecified: Secondary | ICD-10-CM | POA: Diagnosis not present

## 2015-01-19 DIAGNOSIS — R635 Abnormal weight gain: Secondary | ICD-10-CM | POA: Diagnosis not present

## 2015-01-19 DIAGNOSIS — R482 Apraxia: Secondary | ICD-10-CM | POA: Diagnosis not present

## 2015-01-19 DIAGNOSIS — Z0000 Encounter for general adult medical examination without abnormal findings: Secondary | ICD-10-CM | POA: Diagnosis not present

## 2015-01-19 DIAGNOSIS — R488 Other symbolic dysfunctions: Secondary | ICD-10-CM | POA: Diagnosis not present

## 2015-01-19 DIAGNOSIS — Z13220 Encounter for screening for lipoid disorders: Secondary | ICD-10-CM | POA: Diagnosis not present

## 2015-01-19 DIAGNOSIS — Z131 Encounter for screening for diabetes mellitus: Secondary | ICD-10-CM | POA: Diagnosis not present

## 2015-01-19 DIAGNOSIS — R6889 Other general symptoms and signs: Secondary | ICD-10-CM | POA: Diagnosis not present

## 2015-01-19 DIAGNOSIS — R471 Dysarthria and anarthria: Secondary | ICD-10-CM | POA: Diagnosis not present

## 2015-01-19 DIAGNOSIS — Z23 Encounter for immunization: Secondary | ICD-10-CM | POA: Diagnosis not present

## 2015-02-20 ENCOUNTER — Encounter: Payer: Self-pay | Admitting: Vascular Surgery

## 2015-02-27 ENCOUNTER — Ambulatory Visit (INDEPENDENT_AMBULATORY_CARE_PROVIDER_SITE_OTHER): Payer: BLUE CROSS/BLUE SHIELD | Admitting: Vascular Surgery

## 2015-02-27 ENCOUNTER — Encounter: Payer: Self-pay | Admitting: Vascular Surgery

## 2015-02-27 VITALS — BP 109/76 | HR 87 | Temp 97.2°F | Resp 18 | Ht <= 58 in | Wt 122.2 lb

## 2015-02-27 DIAGNOSIS — I8393 Asymptomatic varicose veins of bilateral lower extremities: Secondary | ICD-10-CM | POA: Diagnosis not present

## 2015-02-27 NOTE — Progress Notes (Signed)
[facility] [place]  Patient name: Mackenzie Knight MRN: [id number] DOB: 1991-01-06 Sex: female  REASON FOR CONSULT: Evaluation of bluish discoloration both lower extremities  HPI: Mackenzie Knight is a 25 y.o. female, who is seen today for evaluation of bilateral lower extremity venous discoloration. She is here today with her father. Does have some history of mild developmental delay. Her father reports that this initially occurred around the time of your surgery and she was wearing compression garments in the perioperative period noted to have a bluish discoloration over both lower extremities. She also has an area over the lateral proximal left thigh were she had a ground-level fall and had some bruising with persistent bluish discoloration despite the event happening several years ago. She has no history of DVT and no history of arterial insufficiency.  Past Medical History  Diagnosis Date  . Hearing loss     Family History  Problem Relation Age of Onset  . Lupus Mother   . Scleroderma Mother   . Other Mother     GAVE Disease, Autoimmune Disease, Stomache Bleeds, Low Body Temp, Fatigue  . Breast cancer Maternal Grandmother     Died at 43    SOCIAL HISTORY: Social History   Social History  . Marital Status: Single    Spouse Name: N/A  . Number of Children: N/A  . Years of Education: N/A   Occupational History  . Not on file.   Social History Main Topics  . Smoking status: Never Smoker   . Smokeless tobacco: Never Used  . Alcohol Use: No  . Drug Use: No  . Sexual Activity: No   Other Topics Concern  . Not on file   Social History Narrative    Allergies  Allergen Reactions  . Sulfa Antibiotics Hives    Current Outpatient Prescriptions  Medication Sig Dispense Refill  . Ascorbic Acid (VITAMIN C) 100 MG tablet Take 100 mg by mouth daily.    Marland Kitchen BIOTIN FORTE PO Take by mouth daily.    . cetirizine (ZYRTEC) 5 MG tablet Take 5 mg  by mouth.    Marland Kitchen NIACIN CR PO Take by mouth daily.    . Probiotic Product (ACIDOPHILUS HIGH-POTENCY PO) Take by mouth daily. Takes 2 tablet daily.     No current facility-administered medications for this visit.    REVIEW OF SYSTEMS:  [X]  denotes positive finding, [ ]  denotes negative finding Cardiac  Comments:  Chest pain or chest pressure:    Shortness of breath upon exertion:    Short of breath when lying flat:    Irregular heart rhythm:        Vascular    Pain in calf, thigh, or hip brought on by ambulation:    Pain in feet at night that wakes you up from your sleep:     Blood clot in your veins:    Leg swelling:         Pulmonary    Oxygen at home:    Productive cough:     Wheezing:         Neurologic    Sudden weakness in arms or legs:     Sudden numbness in arms or legs:     Sudden onset of difficulty speaking or slurred speech:    Temporary loss of vision in one eye:     Problems with dizziness:         Gastrointestinal    Blood in stool:  Vomited blood:         Genitourinary    Burning when urinating:     Blood in urine:        Psychiatric    Major depression:         Hematologic    Bleeding problems:    Problems with blood clotting too easily:        Skin    Rashes or ulcers:        Constitutional    Fever or chills:      PHYSICAL EXAM: Filed Vitals:   02/27/15 1021  BP: 109/76  Pulse: 87  Temp: 97.2 F (36.2 C)  TempSrc: Oral  Resp: 18  Height: 4\' 10"  (1.473 m)  Weight: 122 lb 3.2 oz (55.43 kg)  SpO2: 100%    GENERAL: The patient is a well-nourished female, in no acute distress. The vital signs are documented above.  VASCULAR: 2+ radial 2+ dorsalis pedis pulses bilaterally PULMONARY: There is good air exchange  MUSCULOSKELETAL: There are no major deformities or cyanosis. NEUROLOGIC: No focal weakness or paresthesias are detected. SKIN: There are no ulcers or rashes noted. PSYCHIATRIC: The patient has a normal affect. Have some  areas of bluish discoloration under the skin bilaterally. These are true telangiectasia. There is no evidence of any these protruding above the surface of the skin with no varicosities seen.  DATA:  She did not have a formal venous duplex. I did image her legs with SonoSite ultrasound. This showed no dilatation in her saphenous veins bilaterally. 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 No evidence of communication with her saphenous vein from these. The area over her left thigh did not show any evidence of neovascularity under this.  MEDICAL ISSUES: I discussed these issues with the patient and her father present. Do not feel there is any danger. Reassured them that she has no evidence of arterial insufficiency. He states that her feet frequently feel cold. I spent this would be related to flow to the skin surface but she has absolutely normal vascular flow to her feet. This may be slowly progressive regarding the bluish discoloration but would not recommend any further evaluation. They were pleased with this discussion will see me again on an as-needed basis   [place], [place] [facility] [employer]: [phone]

## 2015-03-23 DIAGNOSIS — N946 Dysmenorrhea, unspecified: Secondary | ICD-10-CM | POA: Diagnosis not present

## 2015-03-23 DIAGNOSIS — N760 Acute vaginitis: Secondary | ICD-10-CM | POA: Diagnosis not present

## 2015-04-26 DIAGNOSIS — D485 Neoplasm of uncertain behavior of skin: Secondary | ICD-10-CM | POA: Diagnosis not present

## 2015-04-26 DIAGNOSIS — D225 Melanocytic nevi of trunk: Secondary | ICD-10-CM | POA: Diagnosis not present

## 2015-04-26 DIAGNOSIS — L700 Acne vulgaris: Secondary | ICD-10-CM | POA: Diagnosis not present

## 2015-05-11 DIAGNOSIS — Z6824 Body mass index (BMI) 24.0-24.9, adult: Secondary | ICD-10-CM | POA: Diagnosis not present

## 2015-05-11 DIAGNOSIS — H6980 Other specified disorders of Eustachian tube, unspecified ear: Secondary | ICD-10-CM | POA: Diagnosis not present

## 2015-06-05 DIAGNOSIS — D2272 Melanocytic nevi of left lower limb, including hip: Secondary | ICD-10-CM | POA: Diagnosis not present

## 2015-06-05 DIAGNOSIS — D225 Melanocytic nevi of trunk: Secondary | ICD-10-CM | POA: Diagnosis not present

## 2015-06-05 DIAGNOSIS — D2262 Melanocytic nevi of left upper limb, including shoulder: Secondary | ICD-10-CM | POA: Diagnosis not present

## 2015-06-05 DIAGNOSIS — L700 Acne vulgaris: Secondary | ICD-10-CM | POA: Diagnosis not present

## 2015-06-05 DIAGNOSIS — D485 Neoplasm of uncertain behavior of skin: Secondary | ICD-10-CM | POA: Diagnosis not present

## 2015-08-14 DIAGNOSIS — N9489 Other specified conditions associated with female genital organs and menstrual cycle: Secondary | ICD-10-CM | POA: Diagnosis not present

## 2015-10-15 DIAGNOSIS — L309 Dermatitis, unspecified: Secondary | ICD-10-CM | POA: Diagnosis not present

## 2015-10-15 DIAGNOSIS — L218 Other seborrheic dermatitis: Secondary | ICD-10-CM | POA: Diagnosis not present

## 2015-11-29 DIAGNOSIS — J329 Chronic sinusitis, unspecified: Secondary | ICD-10-CM | POA: Diagnosis not present

## 2016-01-09 ENCOUNTER — Encounter (INDEPENDENT_AMBULATORY_CARE_PROVIDER_SITE_OTHER): Payer: Self-pay | Admitting: *Deleted

## 2016-01-23 ENCOUNTER — Encounter (INDEPENDENT_AMBULATORY_CARE_PROVIDER_SITE_OTHER): Payer: Self-pay | Admitting: Pediatrics

## 2016-01-23 ENCOUNTER — Ambulatory Visit (INDEPENDENT_AMBULATORY_CARE_PROVIDER_SITE_OTHER): Payer: Medicare Other | Admitting: Pediatrics

## 2016-01-23 VITALS — BP 108/80 | HR 80 | Ht <= 58 in | Wt 128.0 lb

## 2016-01-23 DIAGNOSIS — H902 Conductive hearing loss, unspecified: Secondary | ICD-10-CM

## 2016-01-23 DIAGNOSIS — F70 Mild intellectual disabilities: Secondary | ICD-10-CM | POA: Diagnosis not present

## 2016-01-23 DIAGNOSIS — R482 Apraxia: Secondary | ICD-10-CM

## 2016-01-23 NOTE — Patient Instructions (Signed)
Please sign up for My Chart so that you can contact me if you need my assistance.
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
## 2016-01-23 NOTE — Progress Notes (Deleted)
HPI:               PE:

## 2016-02-05 DIAGNOSIS — Z01419 Encounter for gynecological examination (general) (routine) without abnormal findings: Secondary | ICD-10-CM | POA: Diagnosis not present

## 2016-03-04 DIAGNOSIS — L309 Dermatitis, unspecified: Secondary | ICD-10-CM | POA: Diagnosis not present

## 2016-04-14 DIAGNOSIS — H9201 Otalgia, right ear: Secondary | ICD-10-CM | POA: Diagnosis not present

## 2016-04-17 DIAGNOSIS — H6090 Unspecified otitis externa, unspecified ear: Secondary | ICD-10-CM | POA: Diagnosis not present

## 2016-04-17 DIAGNOSIS — H6692 Otitis media, unspecified, left ear: Secondary | ICD-10-CM | POA: Diagnosis not present

## 2016-05-08 DIAGNOSIS — H9190 Unspecified hearing loss, unspecified ear: Secondary | ICD-10-CM | POA: Diagnosis not present

## 2016-05-08 DIAGNOSIS — Z6826 Body mass index (BMI) 26.0-26.9, adult: Secondary | ICD-10-CM | POA: Diagnosis not present

## 2016-06-26 ENCOUNTER — Encounter (HOSPITAL_COMMUNITY): Payer: Self-pay | Admitting: *Deleted

## 2016-06-26 ENCOUNTER — Ambulatory Visit (HOSPITAL_COMMUNITY)
Admission: EM | Admit: 2016-06-26 | Discharge: 2016-06-26 | Disposition: A | Discharge status: Home / Self Care | Payer: BLUE CROSS/BLUE SHIELD | Attending: Internal Medicine | Admitting: Internal Medicine

## 2016-06-26 DIAGNOSIS — H6500 Acute serous otitis media, unspecified ear: Secondary | ICD-10-CM

## 2016-06-26 MED ORDER — AZITHROMYCIN 250 MG PO TABS: 250.0000 mg | ORAL_TABLET | Freq: Every day | ORAL | 0 refills | Status: DC

## 2016-06-26 NOTE — ED Triage Notes (Signed)
Pain  l  Ear   That  Started   A  Few  Days     Ago   Denies   Any  Other     Symptoms

## 2016-06-26 NOTE — ED Provider Notes (Signed)
CSN: [id number]     Arrival date & time 06/26/16  1620 History   None    Chief Complaint  Patient presents with  . Otalgia   (Consider location/radiation/quality/duration/timing/severity/associated sxs/prior Treatment) Patient c/o left ear pain that started a few days ago.  She has hx of chronic ear infections.   The history is provided by the patient.  Otalgia  Location:  Left Behind ear:  No abnormality Quality:  Aching Severity:  Moderate Onset quality:  Sudden Duration:  2 days Timing:  Constant Progression:  Worsening Chronicity:  New Relieved by:  Nothing Worsened by:  Nothing   Past Medical History:  Diagnosis Date  . Hearing loss    Past Surgical History:  Procedure Laterality Date  . Ear Surgeries Right 03/2012   reconstruction of ear drum   . ingrown toenails Left    Family History  Problem Relation Age of Onset  . Lupus Mother   . Scleroderma Mother   . Other Mother        GAVE Disease, Autoimmune Disease, Stomache Bleeds, Low Body Temp, Fatigue  . Breast cancer Maternal Grandmother        Died at 48   Social History  Substance Use Topics  . Smoking status: Never Smoker  . Smokeless tobacco: Never Used  . Alcohol use No   OB History    No data available     Review of Systems  Constitutional: Negative.   HENT: Positive for ear pain.   Eyes: Negative.   Respiratory: Negative.   Cardiovascular: Negative.   Gastrointestinal: Negative.   Endocrine: Negative.   Genitourinary: Negative.   Musculoskeletal: Negative.   Allergic/Immunologic: Negative.   Neurological: Negative.   Hematological: Negative.   Psychiatric/Behavioral: Negative.     Allergies  Sulfa antibiotics  Home Medications   Prior to Admission medications   Medication Sig Start Date End Date Taking? Authorizing Provider  Ascorbic Acid (VITAMIN C) 100 MG tablet Take 100 mg by mouth daily.    [provider]  azithromycin (ZITHROMAX) 250 MG tablet Take 1 tablet  (250 mg total) by mouth daily. Take first 2 tablets together, then 1 every day until finished. 06/26/16   Lysbeth Penner, FNP  BIOTIN FORTE PO Take by mouth daily.    [provider]  cetirizine (ZYRTEC) 5 MG tablet Take 5 mg by mouth. 04/10/14 04/10/15  [provider]  NIACIN CR PO Take by mouth daily.    [provider]  Probiotic Product (ACIDOPHILUS HIGH-POTENCY PO) Take by mouth daily. Takes 2 tablet daily.    [provider]  TRI-LO-MARZIA 0.18/0.215/0.25 MG-25 MCG tab Take 1 tablet by mouth daily. 10/28/15   [provider]   Meds Ordered and Administered this Visit  Medications - No data to display  BP 114/78 (BP Location: Right Arm)   Pulse 80   Temp 98.3 F (36.8 C) (Oral)   Resp 14   LMP 06/12/2016   SpO2 99%  No data found.   Physical Exam  Constitutional: She appears well-developed and well-nourished.  HENT:  Head: Normocephalic and atraumatic.  Right Ear: External ear normal.  Mouth/Throat: Oropharynx is clear and moist.  Left TM erythematous  Eyes: Conjunctivae and EOM are normal. Pupils are equal, round, and reactive to light.  Neck: Normal range of motion. Neck supple.  Cardiovascular: Normal rate, regular rhythm and normal heart sounds.   Pulmonary/Chest: Effort normal and breath sounds normal.  Abdominal: Soft. Bowel sounds are normal.  Nursing  note and vitals reviewed.   Urgent Care Course     Procedures (including critical care time)  Labs Review Labs Reviewed - No data to display  Imaging Review No results found.   Visual Acuity Review  Right Eye Distance:   Left Eye Distance:   Bilateral Distance:    Right Eye Near:   Left Eye Near:    Bilateral Near:         MDM   1. Acute serous otitis media, recurrence not specified, unspecified laterality   Father states that Zpak is best abx's and requests Zpak as directed  Push po fluids, rest, tylenol and motrin otc prn as directed for fever,  arthralgias, and myalgias.  Follow up prn if sx's continue or persist.    Lysbeth Penner, FNP 06/26/16 2056

## 2016-07-03 DIAGNOSIS — H6692 Otitis media, unspecified, left ear: Secondary | ICD-10-CM | POA: Diagnosis not present

## 2016-08-05 DIAGNOSIS — Z6826 Body mass index (BMI) 26.0-26.9, adult: Secondary | ICD-10-CM | POA: Diagnosis not present

## 2016-08-05 DIAGNOSIS — H903 Sensorineural hearing loss, bilateral: Secondary | ICD-10-CM | POA: Diagnosis not present

## 2016-08-05 DIAGNOSIS — H9190 Unspecified hearing loss, unspecified ear: Secondary | ICD-10-CM | POA: Diagnosis not present

## 2016-12-02 ENCOUNTER — Ambulatory Visit (INDEPENDENT_AMBULATORY_CARE_PROVIDER_SITE_OTHER): Payer: BLUE CROSS/BLUE SHIELD | Admitting: Podiatry

## 2016-12-02 ENCOUNTER — Telehealth: Payer: Self-pay | Admitting: Podiatry

## 2016-12-02 ENCOUNTER — Encounter: Payer: Self-pay | Admitting: Podiatry

## 2016-12-02 VITALS — BP 112/70 | HR 82

## 2016-12-02 DIAGNOSIS — L600 Ingrowing nail: Secondary | ICD-10-CM | POA: Diagnosis not present

## 2016-12-02 MED ORDER — TRAMADOL HCL 50 MG PO TABS: 50.0000 mg | ORAL_TABLET | Freq: Three times a day (TID) | ORAL | 0 refills | Status: DC | PRN

## 2016-12-02 NOTE — Progress Notes (Signed)
   Subjective:    Patient ID: Mackenzie Knight, female    DOB: 1990-03-05, 26 y.o.   MRN: [id number]  HPI This patient presents today with her father present in the treatment room. Patient is complaining of approximately one week history of pain and discomfort around the right hallux toenail. The toenails uncomfortable with walking and direct pressure. Patient describes soaking in Epsom salt without any improvement of symptoms. Patient has history of similar problem left hallux toenail which was resolve with permanent nail surgery   Review of Systems  All other systems reviewed and are negative.      Objective:   Physical Exam  Pleasant orientated female 3  Vascular: DP pulses 2/4 bilaterally PT pulses 2/4 bilaterally Capillary reflex within normal limits bilaterally  Neurological: Sensation to 10 g monofilament wire intact 5/5 bilaterally Ankle reflexes reactive bilaterally  Dermatological: No open skin lesions bilaterally The medial and lateral borders the right hallux toenail are incurvated with low-grade erythema. There is no drainage or warmth surrounding the nail margins  Musculoskeletal: There is no restriction ankle, subtalar, midtarsal joints bilaterally No deformities noted bilaterally       Assessment & Plan:   Assessment: Ingrowing medial lateral borders the right hallux toenail  Plan: Today discussed treatment options with patient and patient's father. At this time because patient has had a similar problem on the left hallux toenail I recommended phenol matricectomy's the medial lateral borders the right and left hallux toes similar treatment that was done left hallux. The patient's father and patient verbally consents  The right hallux was blocked with 3 mL of 50-50 mixture of 1% Xylocaine and 0.5% Marcaine. An additional cc of 1% Xylocaine was used before final anesthesia was obtained. The toe is prepped with Betadine and exsanguinated. The medial  lateral borders the right hallux toenail were excised and a phenol matricectomy performed, followed by alcohol rinse. An antibiotic compression dressing was applied and the tourniquet was released. Spontaneous capillary refill time noted in the right hallux. Patient tolerated procedure without any difficulty. Postoperative oral reconstruction provided  Reappoint at patient's request

## 2016-12-02 NOTE — Telephone Encounter (Signed)
My name is Aaron Edelman and I'm calling for my daughter and I had called about two hours ago. She saw Dr. Amalia Hailey this morning and when he numbed her toe, he expressed it should last the biggest part of the day or possibly even until tomorrow. Well, it wore off around 12 noon. She took some ibuprofen and it does not seem like its helped as she is still in pain. I was wondering what else we could try to do because I don't want her like this tomorrow or through Thanksgiving. Please give me a call back at [phone]. Thank you.

## 2016-12-02 NOTE — Addendum Note (Signed)
Addended by: Harriett Sine D on: 12/02/2016 04:06 PM   Modules accepted: Orders

## 2016-12-02 NOTE — Telephone Encounter (Addendum)
Left message informing pt's ftr, Aaron Edelman that I had spoke to one of the doctor's in the office and he had ordered a light narcotic tramadol, and pt could continue the ibuprofen in between the dosing of the tramadol. I also instructed that if the tramadol and the ibuprofen did not control the discomfort pt could begin the soaks today and begin the antibactial ointment dressings. Called Tramadol to [facility].

## 2016-12-02 NOTE — Patient Instructions (Signed)
ANTIBACTERIAL SOAP INSTRUCTIONS  THE DAY AFTER PROCEDURE  Please follow the instructions your doctor has marked.   Shower as usual. Before getting out, place a drop of antibacterial liquid soap (Dial) on a wet, clean washcloth.  Gently wipe washcloth over affected area.  Afterward, rinse the area with warm water.  Blot the area dry with a soft cloth and cover with antibiotic ointment (neosporin, polysporin, bacitracin) and band aid or gauze and tape  Place 3-4 drops of antibacterial liquid soap in a quart of warm tap water.  Submerge foot into water for 85minutes.  If bandage was applied after your procedure, leave on to allow for easy lift off, then remove and continue with soak for the remaining time.  Next, blot area dry with a soft cloth and cover with a bandage.  Apply other medications as directed by your doctor, such aor neosporin antibiotic ointment

## 2016-12-08 ENCOUNTER — Telehealth: Payer: Self-pay | Admitting: Podiatry

## 2016-12-08 NOTE — Telephone Encounter (Signed)
This is Mineola Duan calling for my daughter. She saw Dr. Amalia Hailey last Tuesday for a partial nail removal and she was in pain. The nurse spoke to Dr. Jacqualyn Posey and he prescribed her Tramadol that afternoon. She took it sparingly and it looks good but she is still having pain. I was wondering if there is anything else Dr. Amalia Hailey would recommend or suggest. My best contact number is [phone]. Thank you.

## 2016-12-08 NOTE — Telephone Encounter (Signed)
I spoke with pt's ftr, Aaron Edelman and he states the toe has a little redness, and a gel looking scab to each side of the nail, and still some pain, but basically looks good, pt has used the tramadol basically at bedtime. I told Aaron Edelman that if pt was continuing to have pain, Dr. Amalia Hailey would want to see her again. Aaron Edelman states the toe seems to be doing well. I asked if they were continuing to soak, and he said no. I told him he should have pt soaking 2 times a day for at least 4 weeks then test at the end of the 4th week, by performing the last soak of the day and leaving the antibiotic bandaid off, if the area developed a dry hard scab with out redness, swelling, pain or drainage then the soaks could be stopped, if symptoms continued perform the soaks another 2 weeks and test again. Aaron Edelman states understanding.

## 2016-12-16 ENCOUNTER — Telehealth: Payer: Self-pay

## 2016-12-16 NOTE — Telephone Encounter (Signed)
Spoke with father in regards to patient's continued pain from her ingrown procedure. He states that there is no redness or drainage or any other s/s of infection to the area but the area is still painful. Currently using Epsom salt soaks BID 20 minutes with neopsorin and bandage. I informed him to switch to antibacterial soap soaks 20 min BID, leave area open to air at night and bandage during the day. Advised that if pain continues he is to make an appt for the patient to see Dr Mardene Celeste or one of our other doctors

## 2017-01-18 ENCOUNTER — Encounter (HOSPITAL_COMMUNITY): Payer: Self-pay | Admitting: *Deleted

## 2017-01-18 ENCOUNTER — Other Ambulatory Visit: Payer: Self-pay

## 2017-01-18 ENCOUNTER — Ambulatory Visit (HOSPITAL_COMMUNITY)
Admission: EM | Admit: 2017-01-18 | Discharge: 2017-01-18 | Disposition: A | Discharge status: Home / Self Care | Payer: Medicare Other | Attending: Urgent Care | Admitting: Urgent Care

## 2017-01-18 DIAGNOSIS — H9203 Otalgia, bilateral: Secondary | ICD-10-CM

## 2017-01-18 DIAGNOSIS — H6692 Otitis media, unspecified, left ear: Secondary | ICD-10-CM

## 2017-01-18 DIAGNOSIS — H6690 Otitis media, unspecified, unspecified ear: Secondary | ICD-10-CM

## 2017-01-18 DIAGNOSIS — Z9889 Other specified postprocedural states: Secondary | ICD-10-CM

## 2017-01-18 MED ORDER — CEFDINIR 300 MG PO CAPS: 600.0000 mg | ORAL_CAPSULE | Freq: Every day | ORAL | 0 refills | Status: DC

## 2017-01-18 NOTE — ED Provider Notes (Signed)
  MRN: [id number] DOB: 1990-10-04  Subjective:   Mackenzie Knight is a 27 y.o. female presenting for 1 week history of bilateral ear pain, L>R, had mild drainage of both ears, decreased hearing. Denies fever, bleeding, tinnitus, dizziness, sinus congestion, sinus pain, sore throat. She was seen by her PCP recently but did not have any symptoms at the time. Has a history of ear infections, has reconstructive surgery of her right TM, history of tympanostomy tubes.   Mackenzie Knight is allergic to sulfa antibiotics.  Mackenzie Knight  has a past medical history of Hearing loss. Also  has a past surgical history that includes Ear Surgeries (Right, 03/2012) and ingrown toenails (Left).  Objective:   Vitals: BP 111/78 (BP Location: Right Arm)   Pulse 86   Temp 98.4 F (36.9 C) (Oral)   Resp 16   SpO2 100%   Physical Exam  Constitutional: She is oriented to person, place, and time. She appears well-developed and well-nourished.  HENT:  Right TM of sclerosis. Left TM with ear effusion.  Cardiovascular: Normal rate.  Pulmonary/Chest: Effort normal.  Neurological: She is alert and oriented to person, place, and time.  Skin: Skin is warm and dry.   Assessment and Plan :   Acute otitis media, unspecified otitis media type  Acute ear pain, bilateral  History of ear surgery  History of tympanostomy   Start cefdinir since patient has historically done better with this instead of amoxicillin. Counseled that she may need f/u with her PCP for referral to ENT if her symptoms do not improve. Return-to-clinic precautions discussed, patient verbalized understanding.    Mackenzie Knight, [place] 01/18/17 1334

## 2017-01-18 NOTE — ED Triage Notes (Signed)
Per pt she is having ear pain, per pt it is both ear, per pt father, black discoloration come out of her ear when mother cleaned it. Per pt she can hear good,

## 2017-02-10 ENCOUNTER — Ambulatory Visit: Payer: Medicare Other | Admitting: Podiatry

## 2017-02-17 ENCOUNTER — Ambulatory Visit: Payer: Medicare Other | Admitting: Sports Medicine

## 2017-02-27 ENCOUNTER — Ambulatory Visit (INDEPENDENT_AMBULATORY_CARE_PROVIDER_SITE_OTHER): Payer: BLUE CROSS/BLUE SHIELD | Admitting: Podiatry

## 2017-02-27 ENCOUNTER — Ambulatory Visit: Payer: Medicare Other | Admitting: Podiatry

## 2017-02-27 DIAGNOSIS — L600 Ingrowing nail: Secondary | ICD-10-CM | POA: Diagnosis not present

## 2017-02-27 MED ORDER — HYDROCODONE-IBUPROFEN 5-200 MG PO TABS: 1.0000 | ORAL_TABLET | Freq: Three times a day (TID) | ORAL | 0 refills | Status: DC | PRN

## 2017-02-28 NOTE — Progress Notes (Signed)
Subjective:   Patient ID: Mackenzie Knight, female   DOB: 27 y.o.   MRN: [id number]   HPI Patient presents stating that she has chronic ingrown toenail right big toe and that she like to have it fixed and patient does have a family history of this and presents with father   ROS      Objective:  Physical Exam  Neurovascular status intact with inflamed medial border right hallux with no current drainage noted and no proximal edema erythema or drainage noted     Assessment:  Ingrown toenail deformity right hallux medial border     Plan:  H&P explained condition to family and recommended removal of the border.  Patient wants surgery understanding risk and at this time I infiltrated 60 mg Xylocaine Marcaine mixture did sterile prep to the right big toe and under sterile conditions with sterile instrumentation remove the medial border exposed matrix and applied phenol 3 applications 30 seconds followed by alcohol lavage and sterile dressing.  Given instructions on soaks and reappoint

## 2017-03-05 ENCOUNTER — Ambulatory Visit: Payer: Medicare Other | Admitting: Podiatry

## 2017-03-09 ENCOUNTER — Encounter: Payer: Self-pay | Admitting: Podiatry

## 2017-03-09 ENCOUNTER — Ambulatory Visit (INDEPENDENT_AMBULATORY_CARE_PROVIDER_SITE_OTHER): Payer: BLUE CROSS/BLUE SHIELD | Admitting: Podiatry

## 2017-03-09 DIAGNOSIS — L03031 Cellulitis of right toe: Secondary | ICD-10-CM | POA: Diagnosis not present

## 2017-03-09 MED ORDER — AZITHROMYCIN 250 MG PO TABS: ORAL_TABLET | ORAL | 0 refills | Status: DC

## 2017-03-10 NOTE — Progress Notes (Signed)
Subjective:   Patient ID: Mackenzie Knight, female   DOB: 27 y.o.   MRN: [id number]   HPI Patient presents with father concerned because she still having pain in her big toe and she wants to make sure it is okay   ROS      Objective:  Physical Exam  Neurovascular status intact with localized redness in the right hallux medial side with crusted tissue formation and no active drainage or no proximal edema erythema or drainage noted     Assessment:  Mild localized paronychia infection right hallux     Plan:  Using sterile instrumentation debrided the border with no active drainage and is precautionary measure placed on Z-Pak to be taken for 6 days and gave instructions on home soaks to be continued.  Reappoint to recheck again if symptoms were to persist

## 2017-06-01 ENCOUNTER — Telehealth: Payer: Self-pay | Admitting: Pediatrics

## 2017-06-01 NOTE — Telephone Encounter (Signed)
Returned patients mother phone call to r/s appointment on 05/29. Left message

## 2017-06-05 ENCOUNTER — Encounter (INDEPENDENT_AMBULATORY_CARE_PROVIDER_SITE_OTHER): Payer: Self-pay | Admitting: Pediatrics

## 2017-06-05 ENCOUNTER — Ambulatory Visit (INDEPENDENT_AMBULATORY_CARE_PROVIDER_SITE_OTHER): Payer: Medicare Other | Admitting: Pediatrics

## 2017-06-05 VITALS — BP 114/76 | HR 80 | Ht <= 58 in | Wt 114.2 lb

## 2017-06-05 DIAGNOSIS — H902 Conductive hearing loss, unspecified: Secondary | ICD-10-CM | POA: Diagnosis not present

## 2017-06-05 DIAGNOSIS — F422 Mixed obsessional thoughts and acts: Secondary | ICD-10-CM

## 2017-06-05 DIAGNOSIS — F70 Mild intellectual disabilities: Secondary | ICD-10-CM | POA: Diagnosis not present

## 2017-06-05 DIAGNOSIS — F900 Attention-deficit hyperactivity disorder, predominantly inattentive type: Secondary | ICD-10-CM

## 2017-06-05 NOTE — Patient Instructions (Signed)
I think we should try to have a psychiatrist to provide guidance for pharmacologic treatment of the obsessive-compulsive behavior.  I am pleased that she has lost 14 pounds and hope that she will keep up the physical effort and careful eating habits that have produced this weight loss.  I do not think that treating her with medicine for attention span is in her best interest at this time.  I am very pleased that she is driving and working.

## 2017-06-05 NOTE — Progress Notes (Signed)
Patient: Mackenzie Knight MRN: [id number] Sex: female DOB: [birth year]  Provider: Wyline Copas, MD Location of Care: [facility]  Note type: Routine return visit  History of Present Illness: Referral Source: Dr. Lennie Hummer History from: mother, patient and [facility] chart Chief Complaint: Mild Intellectual Disability/ADD  Mackenzie Knight is a 27 y.o. female who returns on Jun 05, 2017 for the first time since January 23, 2016.  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  Her past history is summarized below.  Mackenzie Knight has done amazingly well despite all of this.  She has a permanent driver's license, although her parents very closely monitor her driving and rarely let her take the car out by herself, unless she is going a very short distance.  Mackenzie Knight has been working out and watching her weight and has lost 14 pounds since her last visit.  She has problems with menstrual periods and has taken a variety of different contraceptive medications.  She tried an IUD, but it had to be removed because of cramps.    She has some problems with obsessive-compulsive behaviors.  She will see things on the Internet that she wants and cannot stop thinking about them and cannot let it go.  Her mother often has to prevent her from purchasing them.  An example was after Mackenzie Knight had gum surgery.  She then wanted to purchase a teeth whitening kit over the Internet.  Her mother persuaded her that she needed to talk with her dentist first before doing so for fear of undoing the gum surgery.  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  She works they are Mondays and Wednesdays.  She is gradually beginning to understand the work flow.  One of her biggest problems is she is easily distracted and she has trouble taking  initiative to do things without direction.  Because Mackenzie Knight is fairly inactive, her mother is worried about her bone density, although she has not had bone density evaluation.  Mackenzie Knight does not drink milk, but she does like cheese and yogurt.  Her general health is good.  Mother had no other concerns today.  Review of Systems: A complete review of systems was remarkable for concerns about holding on to things for too long, weight loss in progress, all other systems reviewed and negative.  Past Medical History Diagnosis Date  . Hearing loss    Hospitalizations: No., Head Injury: No., Nervous System Infections: No., Immunizations up to date: Yes.    Patient was hospitalized due to a viral infection when she was 27 years old.  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 Dr. Anthoney Harada at [facility] wanted to schedual eardrum reconstuction surgery, she has had more hearing loss recently in the right ear.  Her highest developmental quotient as a toddler was 74. When last tested before age 34 it was 26. In March 1996 the patient had episodes of unresponsive staring. EEG at 18 months was normal in the waking state. She was evaluated by Dr. Ginette Otto, child neurologist at [facility]. 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 No further workup was recommended. She has not had any imaging studies of her brain nor chromosomal studies.   She was  given growth hormone at [facility] in an attempt to help her growth. Once off growth hormone she gained 8 pounds in 3 months. She had an irregular menstrual cycle.  She has had extensive physical occupational and speech therapy since she was a toddler. She has a prominent nose and forehead, slightly receding chin line, and short stature. As a young child she had severe dysarthria to the point of being unintelligible.  She  had problems with mood, anxiety, difficulty sleeping, constipation, environmental allergies, and acne.  Behavior History labile mood, and anxiety, obsessive-compulsive behavior  Surgical History Procedure Laterality Date  . Ear Surgeries Right 03/2012   reconstruction of ear drum   . ingrown toenails Left    Family History family history includes Breast cancer in her maternal grandmother; Lupus in her mother; Other in her mother; Scleroderma in her mother. Family history is negative for migraines, seizures, intellectual disabilities, blindness, deafness, birth defects, chromosomal disorder, or autism.  Social History Social Needs  . Financial resource strain: Not on file  . Food insecurity:    Worry: Not on file    Inability: Not on file  . Transportation needs:    Medical: Not on file    Non-medical: Not on file  Tobacco Use  . Smoking status: Never Smoker  . Smokeless tobacco: Never Used  Substance and Sexual Activity  . Alcohol use: No  . Drug use: No  . Sexual activity: Never  Social History Narrative    Mackenzie Knight is a 27 yo female.    She is a Programmer, systems.    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   Allergies Allergen Reactions  . Sulfa Antibiotics Hives   Physical Exam BP 114/76   Pulse 80   Ht 4' 8.75" (1.441 m)   Wt 114 lb 3.2 oz (51.8 kg)   BMI 24.93 kg/m   General: alert, well developed, well nourished, in no acute distress, blond hair, light blue eyes, right handed Head: normocephalic, no dysmorphic features Ears, Nose and Throat: Otoscopic: tympanic membranes normal; pharynx: oropharynx is pink without exudates or tonsillar hypertrophy Neck: supple, full range of motion, no cranial or cervical bruits Respiratory: auscultation clear Cardiovascular: no murmurs, pulses are normal Musculoskeletal: no skeletal deformities or apparent scoliosis Skin: no rashes or neurocutaneous lesions  Neurologic Exam  Mental Status: alert; oriented to person, place  and year; knowledge is normal for age; language is normal Cranial Nerves: visual fields are full to double simultaneous stimuli; extraocular movements are full and conjugate; pupils are round reactive to light; funduscopic examination shows sharp disc margins with normal vessels; symmetric facial strength; midline tongue and uvula; air conduction is greater than bone conduction bilaterally Motor: Normal strength, tone and mass; good fine motor movements; no pronator drift Sensory: intact responses to cold, vibration, proprioception and stereognosis Coordination: good finger-to-nose, rapid repetitive alternating movements and finger apposition Gait and Station: normal gait and station: patient is able to walk on heels, toes and tandem without difficulty; balance is adequate; Romberg exam is negative; Gower response is negative Reflexes: symmetric and diminished bilaterally; no clonus; bilateral flexor plantar responses  Assessment 1. Mixed obsessional thoughts and acts, F42.2. 2. 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 3. Attention deficit hyperactivity disorder, predominantly inattentive type. 4. Mild intellectual disability.  Discussion I am pleased that Mackenzie Knight is doing well in so many ways.  Her mother has been very patient and has empowered her to do anything that she can.  I am not certain what  to do about the obsessive-compulsive disorder.  My belief is that she should probably see a psychiatrist to determine whether or not this is something that needs medication or can be dealt with cognitive behavioral therapy.  If we can get her successfully titrated the medication, I am happy to prescribe it.  I am reluctant to place her on a selective serotonin reuptake inhibitor without professional advice.  Plan Mackenzie Knight will return to see me in 1 year.  I asked her mother to send me a list of the psychiatrists who are on her insurance plan and I will make recommendations.  I spent 25 minutes of  face-to-face time with Mackenzie Knight and her mother, more than half of it in consultation, discussing the issues noted above.   Medication List    Accurate as of 06/05/17 11:59 PM.      ACIDOPHILUS HIGH-POTENCY PO Take by mouth daily. Takes 2 tablet daily.   BIOTIN FORTE PO Take by mouth daily.   drospirenone-ethinyl estradiol 3-0.02 MG tablet Commonly known as:  YAZ,GIANVI,LORYNA Take by mouth.   fluticasone 50 MCG/ACT nasal spray Commonly known as:  FLONASE Please use 2 sprays in each nostril daily. Please use regularly x at least 6 wks before stopping to give medication time to take effect.   vitamin C 100 MG tablet Take 100 mg by mouth daily.    The medication list was reviewed and reconciled. All changes or newly prescribed medications were explained.  A complete medication list was provided to the patient/caregiver.  Jodi Geralds MD

## 2017-06-09 ENCOUNTER — Encounter (INDEPENDENT_AMBULATORY_CARE_PROVIDER_SITE_OTHER): Payer: Self-pay | Admitting: Pediatrics

## 2017-06-10 ENCOUNTER — Ambulatory Visit (INDEPENDENT_AMBULATORY_CARE_PROVIDER_SITE_OTHER): Payer: Medicare Other

## 2017-06-11 ENCOUNTER — Telehealth: Payer: Self-pay | Admitting: Pediatrics

## 2017-06-11 NOTE — Telephone Encounter (Signed)
°  Who's calling (name and relationship to patient) : Mom/Linda  Best contact number: [phone]  Provider they see: Dr Gaynell Face  Reason for call: Mom called in requesting for Dr Gaynell Face to please reply to pt's mychart message sent on Tuesday.

## 2017-06-11 NOTE — Telephone Encounter (Signed)
This came from [employer] phone.  She said that she did not have a response.  I do not know if there is some malfunction or not.  I asked mother to check on that phone and see.  I can not send it to her.

## 2017-08-19 ENCOUNTER — Ambulatory Visit (INDEPENDENT_AMBULATORY_CARE_PROVIDER_SITE_OTHER): Payer: BLUE CROSS/BLUE SHIELD | Admitting: Podiatry

## 2017-08-19 ENCOUNTER — Encounter: Payer: Self-pay | Admitting: Podiatry

## 2017-08-19 DIAGNOSIS — L600 Ingrowing nail: Secondary | ICD-10-CM | POA: Diagnosis not present

## 2017-08-20 NOTE — Progress Notes (Signed)
Subjective:   Patient ID: Mackenzie Knight, female   DOB: 27 y.o.   MRN: [id number]   HPI Patient presents with mother concerned about thickness of the nailbeds of the hallux and whether not anything can be done or if this is a problem stating that she is no longer in pain after we corrected her ingrown toenails   ROS      Objective:  Physical Exam  Neurovascular status intact with slight thinness of the nailbeds but no active damage underlying the nails currently     Assessment:  Appears to be a fairly regular-like appearance with no indications of significant pathology     Plan:  Advised this patient on continued treatment of the nailbeds with routine local care and I do not recommend further treatment unless symptoms were to get worse

## 2017-09-04 ENCOUNTER — Encounter (INDEPENDENT_AMBULATORY_CARE_PROVIDER_SITE_OTHER): Payer: Self-pay

## 2017-09-04 ENCOUNTER — Telehealth (INDEPENDENT_AMBULATORY_CARE_PROVIDER_SITE_OTHER): Payer: Self-pay | Admitting: Pediatrics

## 2017-09-04 NOTE — Telephone Encounter (Signed)
I called mother back.

## 2017-09-04 NOTE — Telephone Encounter (Signed)
°  Who's calling (name and relationship to patient) : Vaughan Basta (mom)  Best contact number: [phone]  Provider they see: Gaynell Face   Reason for call: Mom called stated she returning call.    PRESCRIPTION REFILL ONLY  Name of prescription:  Pharmacy:
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
## 2017-09-04 NOTE — Telephone Encounter (Signed)
I left a message for Mrs. Lavin to call back.

## 2017-11-27 ENCOUNTER — Other Ambulatory Visit: Payer: Self-pay | Admitting: Family Medicine

## 2017-11-27 DIAGNOSIS — R1030 Lower abdominal pain, unspecified: Secondary | ICD-10-CM

## 2017-11-30 ENCOUNTER — Other Ambulatory Visit: Payer: Self-pay | Admitting: Family Medicine

## 2017-11-30 DIAGNOSIS — R748 Abnormal levels of other serum enzymes: Secondary | ICD-10-CM

## 2017-12-04 ENCOUNTER — Other Ambulatory Visit: Payer: BLUE CROSS/BLUE SHIELD

## 2018-11-26 ENCOUNTER — Telehealth (INDEPENDENT_AMBULATORY_CARE_PROVIDER_SITE_OTHER): Payer: Self-pay | Admitting: Pediatrics

## 2018-11-26 NOTE — Telephone Encounter (Signed)
°  Who's calling (name and relationship to patient) : Vaughan Basta (Mother)  Best contact number: [phone] Provider they see: Dr. Gaynell Face Reason for call: Mom stated she would like to speak with Dr. Gaynell Face regarding pt's condition. I informed mom that Dr. Gaynell Face will be back in the office on Tuesday. She voiced understanding.

## 2018-11-26 NOTE — Telephone Encounter (Signed)
Spoke with mom about her phone message. She states that she is having some reactions and want to discuss them with him. She would not give me any other details.

## 2018-11-29 NOTE — Telephone Encounter (Signed)
I left a message that I had called and asked mother to return my phone call tomorrow.

## 2018-11-30 NOTE — Telephone Encounter (Signed)
Mackenzie Knight is unable to work, at home, on the Internet constantly, buying things that she does not need, becoming upset when she cannot get all the things that she wants.  Her mother is at wits end.  I recommended that she consult Dr. Raquel James.  I hope that [name] will see her.  Even though she is an adult, cognitively emotionally she is a child.  I asked mother to circle back around let me know if this is not feasible.  I am not convinced that medication would work to solve this problem.

## 2020-05-09 DIAGNOSIS — R197 Diarrhea, unspecified: Secondary | ICD-10-CM | POA: Diagnosis not present

## 2020-05-10 DIAGNOSIS — Z03818 Encounter for observation for suspected exposure to other biological agents ruled out: Secondary | ICD-10-CM | POA: Diagnosis not present

## 2020-05-10 DIAGNOSIS — R197 Diarrhea, unspecified: Secondary | ICD-10-CM | POA: Diagnosis not present

## 2020-05-18 ENCOUNTER — Ambulatory Visit (INDEPENDENT_AMBULATORY_CARE_PROVIDER_SITE_OTHER): Payer: Medicare Other | Admitting: Pediatrics

## 2020-05-18 ENCOUNTER — Encounter (INDEPENDENT_AMBULATORY_CARE_PROVIDER_SITE_OTHER): Payer: Self-pay | Admitting: Pediatrics

## 2020-05-18 ENCOUNTER — Other Ambulatory Visit: Payer: Self-pay

## 2020-05-18 VITALS — BP 100/68 | HR 76 | Ht <= 58 in | Wt 110.8 lb

## 2020-05-18 DIAGNOSIS — H905 Unspecified sensorineural hearing loss: Secondary | ICD-10-CM

## 2020-05-18 DIAGNOSIS — F70 Mild intellectual disabilities: Secondary | ICD-10-CM

## 2020-05-18 NOTE — Progress Notes (Signed)
Patient: Mackenzie Knight MRN: [id number] Sex: female DOB: [birth year]  Provider: Wyline Copas, MD Location of Care: [facility]  Note type: Routine return visit  History of Present Illness: Referral Source: Mackenzie Pepper, MD History from: patient, [facility] chart and mom Chief Complaint: Mild intellectual disability  Mackenzie Knight is a 30 y.o. female who was evaluated May 18, 2020 for the first time since Jun 05, 2017.  There is been a patient of mine since she was quite young.  She has developmental delay, intellectual disability, oral motor apraxia as part of a static encephalopathy.  Clear has lost and maintained her weight loss through careful eating and physical activity.  She has had problems with dysmenorrhea but has not been able to tolerate treatments.  She has obsessive compulsive behaviors and thoughts although I think these have improved somewhat over time.  This was not a major issue today.  She is not been working outside the home nor has she been involved in community activities such as the Therapist, occupational.  Her 76 year old grandfather lives with the family and has for a year.  He has a number of disabilities that make it dangerous for him to live alone although he has retained his cognitive abilities.  Though he has been appropriately vaccinated and boosted, his daughter, Mackenzie Knight's mother, is worried about him getting seriously ill from an infection.  His primary doctor has not been willing to test him for circulating antibodies stating that it would give either a false impression or a sense of security that was not warranted based on her understanding of how COVID affects the extreme elderly.  Mother herself has underlying immune disorder, but has been tested for COVID antibodies and apparently has a robust response to vaccinations that has persisted.  She has had 2 vaccinations and to booster vaccinations.  Care continues to have some  difficulty with arousals at nighttime.  She goes to bed anywhere from 7:30 PM to 9:30 PM.  She is chronically tired.  She is taking some form of a substance called Macha in a tea that she purchased from [place] that caused significant gastric upset.  Once this was figured out, that has settled.  Review of Systems: A complete review of systems was assessed and was negative.  Past Medical History Diagnosis Date  . Hearing loss    Hospitalizations: No., Head Injury: No., Nervous System Infections: No., Immunizations up to date: Yes.    Copied from prior chart notes Patient was hospitalized due toaviral infectionwhen she was 30 years old.  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 Dr. Anthoney Harada at [facility] schedual eardrum reconstuction surgery, she has had more hearing loss recently in the right ear.  Her highest developmental quotient as a toddler was 68. When last tested before age 67 it was 61. In March 1996 the patient had episodes of unresponsive staring. EEG at 18 months was normal in the waking state. She was evaluated by Dr. Ginette Otto, child neurologist at [facility]. 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 No further workup was recommended. She has not had any imaging studies of her brain nor chromosomal studies.   She was given growth hormone at [facility] in an attempt to help her growth. Once off growth hormone she gained 8 pounds in 3 months. She had an irregular menstrual cycle.  She has had extensive physical occupational and speech therapy since she was a toddler. She has a prominent noseand forehead, slightly receding chin line, and short stature. As a young child she had severe dysarthria to the point of being unintelligible.  She had problems with mood, anxiety, difficulty sleeping, constipation, environmental  allergies, and acne.  Behavior History labile mood, and anxiety, obsessive-compulsive behavior  Surgical History Procedure Laterality Date  . Ear Surgeries Right 03/2012   reconstruction of ear drum   . ingrown toenails Left    Family History family history includes Breast cancer in her maternal grandmother; Lupus in her mother; Other in her mother; Scleroderma in her mother. Family history is negative for migraines, seizures, intellectual disabilities, blindness, deafness, birth defects, chromosomal disorder, or autism.  Social History Socioeconomic History  . Marital status: Single  . Years of education:  13+  . Highest education level:  High school certificate  Occupational History  . Not employed  Tobacco Use  . Smoking status: Never Smoker  . Smokeless tobacco: Never Used  Vaping Use  . Vaping Use: Never used  Substance and Sexual Activity  . Alcohol use: No  . Drug use: No  . Sexual activity: Never  Social History Narrative    [name] is a 30 yo female.    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 Sulfa Antibiotics Hives   Physical Exam BP 100/68   Pulse 76   Ht 4' 8.5" (1.435 m)   Wt 110 lb 12.8 oz (50.3 kg)   BMI 24.40 kg/m   General: alert, well developed, well nourished, in no acute distress, blond hair, light blue eyes, right handed Head: normocephalic, prominent forehead, jaw Ears, Nose and Throat: Otoscopic: tympanic membranes normal; pharynx: oropharynx is pink without exudates or tonsillar hypertrophy Neck: supple, full range of motion, no cranial or cervical bruits Respiratory: auscultation clear Cardiovascular: no murmurs, pulses are normal Musculoskeletal: no skeletal deformities or apparent scoliosis Skin: no rashes or neurocutaneous lesions  Neurologic Exam  Mental Status: alert; oriented to person, place; knowledge is below normal for age; language is below  normal, but she is able to convey thoughts and feelings follow commands, she has a somewhat flat affect Cranial Nerves: visual fields are full to double simultaneous stimuli; extraocular movements are full and conjugate; pupils are round reactive to light; funduscopic examination shows sharp disc margins with normal vessels; symmetric facial strength; midline tongue and uvula; air conduction is greater than bone conduction bilaterally Motor: normal strength, tone and mass; good fine motor movements; no pronator drift Sensory: intact responses to cold, vibration, proprioception and stereognosis Coordination: good finger-to-nose, rapid repetitive alternating movements and finger apposition Gait and Station: normal gait and station: patient is able to walk on heels, toes and tandem without difficulty; balance is fair; Romberg exam is negative; Gower response is negative Reflexes: symmetric and diminished bilaterally; no clonus; bilateral flexor plantar responses  Assessment 1.  Mild intellectual disability, F70. 2.  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  Discussion She is medically and neurologically stable.  There is no specific treatment that she needs at this time.  Plan Since I had not seen her in 3 years, I planned to send a letter informing her that I was retiring and giving her the option to be seen in either the adult neurology clinics.  Given that she is come back for follow-up, I intend to find out if  there are any adult neurologist who have experience in dealing with special needs adults.  If that is the case, I would not hesitate to refer her if that is not the case, then I think she is to stay with our practice.  I do not think she needs any seen any more often than once a year and in particular that would be done in case any letters needed to be filed on her behalf.  I think that her mother is making a good decision concerning contraction of COVID even though she has been vaccinated.  Her  grandfather is very vulnerable even though he has been appropriately vaccinated.  It is unclear at what point anyone would feel comfortable sending their children out into public settings feeling that they will be safe.  Greater than 50% of a 30-minute visit was spent in counseling coordination of care concerning her underlying encephalopathy and discussing transition of care.   Medication List   Accurate as of May 18, 2020  1:30 PM. If you have any questions, ask your nurse or doctor.      TAKE these medications   ACIDOPHILUS HIGH-POTENCY PO Take by mouth daily. Takes 2 tablet daily.   BIOTIN FORTE PO Take by mouth daily.   drospirenone-ethinyl estradiol 3-0.02 MG tablet Commonly known as: YAZ Take by mouth.   vitamin C 100 MG tablet Take 100 mg by mouth daily.     The medication list was reviewed and reconciled. All changes or newly prescribed medications were explained.  A complete medication list was provided to the patient/caregiver.  Jodi Geralds MD

## 2020-05-18 NOTE — Patient Instructions (Signed)
It was a pleasure to see you in the office today.  Mackenzie Knight is healthy and stable.  I understand that her inability to engage in gainful employment outside the home or participate in community theater group have been significantly affected by COVID, and the need to provide care for her grandfather in your home.  As I explained to you her situation in my practice is that we will drop from 5 providers before unless we can find another 1 between now and September which I think is unlikely.  I believe that we will get to 5 over the next year.  We also working with developmental and psychologic center to see if we can leverage their experience with adults to provide ongoing care to adults with special needs.  I will retire October 12, 2020.  The plan is for clearly seen here in a year.  I do not know who she will be seen by.  The only reason why I would change that is if I became aware of an adult neurologist in our community who had interest in skills in treating and relating to adults with special needs.  I hope to communicate clearly to you what the situation is but you should assume that she will be seen in this practice unless you were told different.

## 2020-05-20 ENCOUNTER — Encounter (INDEPENDENT_AMBULATORY_CARE_PROVIDER_SITE_OTHER): Payer: Self-pay

## 2020-09-24 DIAGNOSIS — S61215A Laceration without foreign body of left ring finger without damage to nail, initial encounter: Secondary | ICD-10-CM | POA: Diagnosis not present

## 2020-09-24 DIAGNOSIS — W540XXA Bitten by dog, initial encounter: Secondary | ICD-10-CM | POA: Diagnosis not present

## 2020-10-04 DIAGNOSIS — S61215A Laceration without foreign body of left ring finger without damage to nail, initial encounter: Secondary | ICD-10-CM | POA: Diagnosis not present

## 2020-10-08 DIAGNOSIS — H1030 Unspecified acute conjunctivitis, unspecified eye: Secondary | ICD-10-CM | POA: Diagnosis not present

## 2020-12-03 DIAGNOSIS — R059 Cough, unspecified: Secondary | ICD-10-CM | POA: Diagnosis not present

## 2020-12-03 DIAGNOSIS — J988 Other specified respiratory disorders: Secondary | ICD-10-CM | POA: Diagnosis not present

## 2020-12-05 DIAGNOSIS — R051 Acute cough: Secondary | ICD-10-CM | POA: Diagnosis not present

## 2020-12-14 DIAGNOSIS — N6001 Solitary cyst of right breast: Secondary | ICD-10-CM | POA: Diagnosis not present

## 2020-12-14 DIAGNOSIS — L709 Acne, unspecified: Secondary | ICD-10-CM | POA: Diagnosis not present

## 2020-12-14 DIAGNOSIS — L700 Acne vulgaris: Secondary | ICD-10-CM | POA: Diagnosis not present

## 2020-12-14 DIAGNOSIS — L0292 Furuncle, unspecified: Secondary | ICD-10-CM | POA: Diagnosis not present

## 2020-12-14 DIAGNOSIS — Z79899 Other long term (current) drug therapy: Secondary | ICD-10-CM | POA: Diagnosis not present

## 2020-12-18 ENCOUNTER — Other Ambulatory Visit: Payer: Self-pay | Admitting: Nurse Practitioner

## 2020-12-18 DIAGNOSIS — N6001 Solitary cyst of right breast: Secondary | ICD-10-CM

## 2020-12-20 DIAGNOSIS — N6001 Solitary cyst of right breast: Secondary | ICD-10-CM | POA: Diagnosis not present

## 2021-01-02 DIAGNOSIS — N6311 Unspecified lump in the right breast, upper outer quadrant: Secondary | ICD-10-CM | POA: Diagnosis not present

## 2021-01-02 DIAGNOSIS — N6021 Fibroadenosis of right breast: Secondary | ICD-10-CM | POA: Diagnosis not present

## 2021-01-13 HISTORY — PX: BREAST EXCISIONAL BIOPSY: SUR124

## 2021-01-15 DIAGNOSIS — N6489 Other specified disorders of breast: Secondary | ICD-10-CM | POA: Diagnosis not present

## 2021-01-15 DIAGNOSIS — N6310 Unspecified lump in the right breast, unspecified quadrant: Secondary | ICD-10-CM | POA: Diagnosis not present

## 2021-02-07 ENCOUNTER — Telehealth (INDEPENDENT_AMBULATORY_CARE_PROVIDER_SITE_OTHER): Payer: Self-pay | Admitting: Family

## 2021-02-07 DIAGNOSIS — N6310 Unspecified lump in the right breast, unspecified quadrant: Secondary | ICD-10-CM | POA: Diagnosis not present

## 2021-02-07 DIAGNOSIS — N6315 Unspecified lump in the right breast, overlapping quadrants: Secondary | ICD-10-CM | POA: Diagnosis not present

## 2021-02-07 DIAGNOSIS — D241 Benign neoplasm of right breast: Secondary | ICD-10-CM | POA: Diagnosis not present

## 2021-02-07 DIAGNOSIS — N6021 Fibroadenosis of right breast: Secondary | ICD-10-CM | POA: Diagnosis not present

## 2021-02-07 HISTORY — PX: BREAST LUMPECTOMY: SHX2

## 2021-02-07 NOTE — Telephone Encounter (Signed)
The form has been completed. Please copy for our records, then let Mom know it is ready for pick up. Thanks, [name]

## 2021-02-07 NOTE — Telephone Encounter (Signed)
°  Who's calling (name and relationship to patient) :mom/ Dietrich Pates number:[phone]  Provider they DHD:[facility]   Reason for call:dropped off and placed DMV paperwork to be filled out on Tina's Desk. Please call when papers are ready for pick up.      PRESCRIPTION REFILL ONLY  Name of prescription:  Pharmacy:

## 2021-02-08 NOTE — Telephone Encounter (Signed)
Left a HIPAA compliant voicemail informing mom the form was completed and available for her to pick up at their earliest convenience.

## 2021-02-19 DIAGNOSIS — Z48817 Encounter for surgical aftercare following surgery on the skin and subcutaneous tissue: Secondary | ICD-10-CM | POA: Diagnosis not present

## 2021-02-19 DIAGNOSIS — D241 Benign neoplasm of right breast: Secondary | ICD-10-CM | POA: Diagnosis not present

## 2021-03-06 DIAGNOSIS — H938X1 Other specified disorders of right ear: Secondary | ICD-10-CM | POA: Diagnosis not present

## 2021-03-15 DIAGNOSIS — H9201 Otalgia, right ear: Secondary | ICD-10-CM | POA: Diagnosis not present

## 2021-03-19 DIAGNOSIS — H9071 Mixed conductive and sensorineural hearing loss, unilateral, right ear, with unrestricted hearing on the contralateral side: Secondary | ICD-10-CM | POA: Diagnosis not present

## 2021-04-25 DIAGNOSIS — R42 Dizziness and giddiness: Secondary | ICD-10-CM | POA: Diagnosis not present

## 2021-05-22 ENCOUNTER — Ambulatory Visit (INDEPENDENT_AMBULATORY_CARE_PROVIDER_SITE_OTHER): Payer: Medicare Other | Admitting: Family

## 2021-05-22 ENCOUNTER — Encounter (INDEPENDENT_AMBULATORY_CARE_PROVIDER_SITE_OTHER): Payer: Self-pay | Admitting: Family

## 2021-05-22 VITALS — BP 114/68 | Wt 115.0 lb

## 2021-05-22 DIAGNOSIS — R471 Dysarthria and anarthria: Secondary | ICD-10-CM

## 2021-05-22 DIAGNOSIS — G44219 Episodic tension-type headache, not intractable: Secondary | ICD-10-CM | POA: Diagnosis not present

## 2021-05-22 DIAGNOSIS — H902 Conductive hearing loss, unspecified: Secondary | ICD-10-CM

## 2021-05-22 DIAGNOSIS — G43009 Migraine without aura, not intractable, without status migrainosus: Secondary | ICD-10-CM

## 2021-05-22 DIAGNOSIS — R42 Dizziness and giddiness: Secondary | ICD-10-CM

## 2021-05-22 DIAGNOSIS — F70 Mild intellectual disabilities: Secondary | ICD-10-CM | POA: Diagnosis not present

## 2021-05-22 MED ORDER — TOPIRAMATE 15 MG PO CPSP: ORAL_CAPSULE | ORAL | 1 refills | Status: DC

## 2021-05-22 NOTE — Progress Notes (Signed)
? ?Mackenzie Knight   ?MRN:  [id number]  ?[birth year]  ? ?Provider: Rockwell Germany NP-C ?Location of Care: [facility] ? ?Visit type: Return visit  ? ?Last visit: 05/18/2020 with Dr Gaynell Face ? ?Referral source: London Pepper, MD ?History from: Epic chart, patient and her mother ? 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 ?Today's concerns: ?Mackenzie Knight and her mother report today that she has been experiencing headaches and dizziness for about the last 2-3 months. She reports that she awakens almost every day with a pounding headache at the crown of her head. She says that she is also dizzy and has intermittent dizziness during the day after the headache resolves. Mackenzie Knight reports that she sometimes takes Tylenol or Motrin, which dulls the pain but does not abort it.  ? ?Mackenzie Knight reports that she does not skip meals and that she drinks at least about 50-60 oz of water each day. She says that she usually sleeps well at night. Mackenzie Knight and her mother report that she has been stressed because the family is living with her 58 year old grandfather, while their home is being renovated to be more accessible for him to live with them. They say that the grandfather is fairly self sufficient but that he is irritable and unpleasant most of the time, which is upsetting to Mackenzie Knight.  ? ?Mom reports that Mackenzie Knight was taking Spironalactone for acne but that she stopped it when Mom read that it could cause headache and dizziness. Mackenzie Knight has had problems with irregular menses and is on an oral contraceptive for that.  Mackenzie Knight was recently evaluated by ENT because of concern about dizziness and Mom reports that no abnormalities were found.  ? ?Mom reports that Mackenzie Knight has had headaches in the past and sometimes has headaches with her menstrual cycle. She reports that Mackenzie Knight's sister  has migraine headaches. Mackenzie Knight has not had any known head trauma. ? ?Mackenzie Knight does not work but helps her mother around the home. She has a dog that she takes care of each day. She is looking forward to being a bridesmaid in her sister's wedding in July.  ? ?Mackenzie Knight has been otherwise generally healthy since she was last seen. Neither she nor her mother have other health concerns for her today other than previously mentioned. ? ?Review of systems: ?Please see HPI for neurologic and other pertinent review of systems. Otherwise all other systems were reviewed and were negative. ? ?Problem List: ?Patient Active Problem List  ? Diagnosis Date Noted  ? 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 Episodic tension type headache 08/10/2014  ? Fatigue 08/09/2014  ? Dizziness 07/20/2013  ? Deafness, sensorineural 09/01/2012  ? Obsessive-compulsive disorder 05/25/2012  ? Attention deficit disorder 05/25/2012  ? Lack of coordination 05/25/2012  ? Dysarthria 05/25/2012  ? Apraxia 05/25/2012  ? Conductive hearing loss, middle ear 12/23/2011  ?  ? ?Past Medical History:  ?Diagnosis Date  ? Hearing loss   ?  ?Past medical history comments: See HPI ?Copied from previous record: ?Patient was hospitalized due to a viral infection when she was 31 years old. ?   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 Dr. Anthoney Harada at [facility] wanted to schedual eardrum reconstuction surgery, she has had more hearing loss recently  in the right ear. ?   ?Her highest developmental quotient as a toddler was 15. When last tested before age 56 it was 31. In March 1996 the patient had episodes of unresponsive staring. EEG at 18 months was normal in the waking state. She was evaluated by Dr. Ginette Otto, child neurologist at [facility]. 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 No further workup was recommended. She has not had any imaging studies of her brain nor chromosomal studies.  ?  ?She was given growth hormone at [facility] in an attempt to help her growth. Once off growth hormone she gained 8 pounds in 3 months. She had an irregular menstrual cycle. ?   ?She has had extensive physical occupational and speech therapy since she was a toddler. She has a prominent nose and forehead, slightly receding chin line, and short stature. As a young child she had severe dysarthria to the point of being unintelligible. ?   ?She had problems with mood, anxiety, difficulty sleeping, constipation, environmental allergies, and acne. ?  ?Behavior History ?labile mood, and anxiety, obsessive-compulsive behavior ? ?Surgical history: ?Past Surgical History:  ?Procedure Laterality Date  ? Ear Surgeries Right 03/2012  ? reconstruction of ear drum   ? ingrown toenails Left   ?  ? ?Family history: ?family history includes Breast cancer in her maternal grandmother; Lupus in her mother; Other in her mother; Scleroderma in her mother.  ? ?Social history: ?Social History  ? ?Socioeconomic History  ? Marital status: Single  ?  Spouse name: Not on file  ? Number of children: Not on file  ? Years of education: Not on file  ? Highest education level: Not on file  ?Occupational History  ? Not on file  ?Tobacco Use  ? Smoking status: Never  ?  Passive exposure: Never  ? Smokeless tobacco: Never  ?Vaping Use  ? Vaping Use: Never used  ?Substance and Sexual Activity  ? Alcohol use: No  ? Drug use: No  ? Sexual activity: Never  ?Other Topics Concern  ? Not on file  ?Social History Narrative  ? Mackenzie Knight is a 31 yo female.  ? She is a Programmer, systems.  ? 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  ? ?Social Determinants of Health  ? ?Financial Resource Strain: Not on file  ?Food Insecurity: Not on file  ?Transportation Needs: Not on file  ?Physical Activity: Not on file  ?Stress: Not on file   ?Social Connections: Not on file  ?Intimate Partner Violence: Not on file  ?  ?Past/failed meds: ? ?Allergies: ?Allergies  ?Allergen Reactions  ? Sulfa Antibiotics Hives  ?  ?Immunizations: ? ?There is no immunization history on file for this patient.  ? ? ?Diagnostics/Screenings: ? ?Physical Exam: ?BP 114/68   Wt 115 lb (52.2 kg)   BMI 25.33 kg/m?   ?General: Well developed, well nourished young woman, seated, in no evident distress ?Head: Head normocephalic and atraumatic.  Oropharynx benign. Prominent forehead and jaw. ?Neck: Supple ?Cardiovascular: Regular rate and rhythm, no murmurs ?Respiratory: Breath sounds clear to auscultation ?Musculoskeletal: No obvious deformities or scoliosis ?Skin: No rashes or neurocutaneous lesions ? ?Neurologic Exam ?Mental Status: Awake and fully alert.  Oriented to place and time. Fund of knowledge subnormal for age. She needed some help from her mother to answer questions and relay information. She has mild dysarthria.  Mood and affect appropriate. ?Cranial Nerves: Fundoscopic exam reveals  sharp disc margins.  Pupils equal, briskly reactive to light.  Extraocular movements full without nystagmus.  Facial sensation intact.  Face tongue, palate move normally and symmetrically. Shoulder shrug normal ?Motor: Normal bulk and tone. Normal strength in all tested extremity muscles. ?Sensory: Intact to touch and temperature in all extremities.  ?Coordination: Rapid alternating movements normal in all extremities.  Finger-to-nose and heel-to shin performed accurately bilaterally.  Romberg negative. ?Gait and Station: Arises from chair without difficulty.  Stance is normal. Gait demonstrates normal stride length and balance.   Able to heel, toe and tandem walk without difficulty. ?Reflexes: 1+ and symmetric. Toes downgoing.  ? ?Impression: ?Migraine without aura and without status migrainosus, not intractable - Plan: topiramate (TOPAMAX) 15 MG capsule ? ?Mild intellectual  disability ? ?Episodic tension-type headache, not intractable ? ?Dizziness ? ?Dysarthria ? ?Conductive hearing loss, middle ear  ? ?Recommendations for plan of care: ?The patient's previous Epic records were reviewed. Mackenzie Knight

## 2021-05-22 NOTE — Patient Instructions (Addendum)
Thank you for coming in today. You have a condition called migraine without aura. This is a type of severe headache that occurs in a normal brain and often runs in families. Your examination was normal. To treat your migraines we will try the following - medications and lifestyle measures.  ?  ?To reduce the frequency of the migraines, we will try a medication that is FDA approved to prevent migraines from occurring. This medication is Topiramate. To take it you will take 1 capsule at bedtime for 1 week, then you will take 2 capsules at bedtime after that. It is important that you drink plenty of water while taking this medication to prevent side effects of tingling in your fingers and toes.  ?  ?There are natural substances that are also known to help with headaches. These are Magnesium and Riboflavin. You can sometimes find a combination tablet called Migrelief - this has both supplements in 1 tablet. If you decide to try the supplements, take 1 tablet of Magnesium and 1 tablet of Riboflavin with a meal once per day. If you decide to try the combination tablet - Migrelief- take 1 tablet once per day ?  ?There are some things that you can do that will help to minimize the frequency and severity of headaches. These are: ?1. Get enough sleep and sleep in a regular pattern ?2. Hydrate yourself well ?3. Don't skip meals  ?4. Take breaks when working at a computer or playing video games ?5. Exercise every day ?6. Manage stress ?  ?You should be getting at least 8-9 hours of sleep each night. Bedtime should be a set time for going to bed and getting up with few exceptions. Try to avoid napping during the day as this interrupts nighttime sleep patterns. If you need to nap during the day, it should be less than 45 minutes and should occur in the early afternoon.  ?  ?You should be drinking 60oz of water per day, more on days when you exercise or are outside in summer heat. Try to avoid beverages with sugar and caffeine as  they add empty calories, increase urine output and defeat the purpose of hydrating your body. Consider adding in a sports drink such as sugarfree Gatorade or Powerade once per day ?  ?You should be eating 3 meals per day. If you are very active, you may need to also have a couple of snacks per day.  ?  ?If you work at a computer or laptop, play games on a computer, tablet, phone or device such as a playstation or xbox, remember that this is continuous stimulation for your eyes. Take breaks at least every 30 minutes. Also there should be another light on in the room - never play in total darkness as that places too much strain on your eyes.  ?  ?Exercise at least 20-30 minutes every day - not strenuous exercise but something like walking, stretching, etc.  ?  ?Keep a headache diary and bring it with you when you come back for your next visit.  ?  ?Please sign up for MyChart if you have not done so. ?  ?Please plan to return for follow up in 6 weeks or sooner if needed. ? ?At [facility], we are committed to providing exceptional care. You will receive a patient satisfaction survey through text or email regarding your visit today. Your opinion is important to me. Comments are appreciated.   ?

## 2021-07-08 ENCOUNTER — Telehealth (INDEPENDENT_AMBULATORY_CARE_PROVIDER_SITE_OTHER): Payer: Self-pay | Admitting: Family

## 2021-07-08 DIAGNOSIS — R42 Dizziness and giddiness: Secondary | ICD-10-CM

## 2021-07-08 DIAGNOSIS — H905 Unspecified sensorineural hearing loss: Secondary | ICD-10-CM

## 2021-07-08 DIAGNOSIS — R519 Headache, unspecified: Secondary | ICD-10-CM

## 2021-07-08 NOTE — Telephone Encounter (Signed)
Who's calling (name and relationship to patient) : Mackenzie Knight mom   Best contact number: [phone]  Provider they see: Elveria Rising  Reason for call: Headaches are really bad. Mom would like to do more testing to see if it is something more serious.    Call ID:      PRESCRIPTION REFILL ONLY  Name of prescription:  Pharmacy:

## 2021-07-08 NOTE — Telephone Encounter (Signed)
I called and spoke with Mom. She said that [place] had changes and mood and was emotional and crying on Topiramate, so the medication was stopped. She reports ongoing severe occipital headaches with intermittent dizziness, nausea and vomiting. OTC medications have been ineffective in relieving headache pain. Mom reports that Mackenzie Knight does not skip meals and drinks plenty of water each day. She says that she has had trouble sleeping because of pain keeping her awake. Mom denies that Mackenzie Knight has any problems with mood at this time. Mom is very fearful that something ominous is wrong and wants to have testing done to evaluate before trying other medical treatment. I explained that MRI of the brain would likely require sedation for Mackenzie Knight and Mom agreed. I explained that we have to obtain insurance approval before the study will be scheduled, and that she should receive a call from the hospital to schedule the imaging if insurance approves it. TG

## 2021-07-10 DIAGNOSIS — L700 Acne vulgaris: Secondary | ICD-10-CM | POA: Diagnosis not present

## 2021-07-11 ENCOUNTER — Telehealth (INDEPENDENT_AMBULATORY_CARE_PROVIDER_SITE_OTHER): Payer: Self-pay | Admitting: Family

## 2021-07-11 DIAGNOSIS — G43009 Migraine without aura, not intractable, without status migrainosus: Secondary | ICD-10-CM

## 2021-07-11 MED ORDER — PROPRANOLOL HCL 10 MG PO TABS: ORAL_TABLET | ORAL | 1 refills | Status: DC

## 2021-07-11 NOTE — Telephone Encounter (Signed)
Who's calling (name and relationship to patient) : Mackenzie Knight; mom  Best contact number: [phone]  Provider they see: Rockwell Germany  Reason for call: Mom has called in wanting to speak with [name] regarding some concerns. Mom has requested a call back asap.  Call ID:      PRESCRIPTION REFILL ONLY  Name of prescription:  Pharmacy:

## 2021-07-11 NOTE — Telephone Encounter (Signed)
I called and spoke with Mom. She said that Mackenzie Knight continues to have daily headaches and never seems to get relief. The MRI is scheduled for July 27th and Mom wants to try a medication to reduce how often headaches occur before then. I talked with Mom about [name]'s anxiety and she agreed that it might be a problem but she felt that the anxiety was triggered by being in pain. After discussion she agreed to try Propanolol for migraine prevention. I sent in a prescription as requested. TG

## 2021-07-24 NOTE — Progress Notes (Signed)
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 She has been experiencing headaches and dizziness since about February or March, 2023   Today's concerns: Mackenzie Knight returns for follow up on increased headache frequency since earlier this year. She started Propranolol at the end of June and reports today that she is no longer experiencing headaches. 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   Mackenzie Knight admits to ongoing dizziness. She says that she does not skip meals and that she drinks water all day but admits that she has not had any fluids this morning.  An MRI ordered at her last visit and will be done later this month at [facility] with anesthesia sedation.   Mackenzie Knight has been otherwise generally healthy since she was last seen. Neither she nor her mother have other health concerns for her today other than previously mentioned.  Review of systems: Please see HPI for neurologic and other pertinent review of systems. Otherwise all other systems were reviewed and were negative.  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 Dr. Anthoney Harada at [facility] wanted to schedual eardrum reconstuction surgery, she has had more hearing loss recently in the right ear.    Her highest developmental quotient as a toddler was 13. When last tested before age 36 it was 3. In March 1996 the patient had episodes of unresponsive staring. EEG at 18 months was normal in the waking state. She was evaluated by Dr. Ginette Otto, child neurologist at [facility]. 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 No further workup was recommended. She has not had any imaging studies of her brain nor chromosomal studies.    She was given growth hormone at [facility] in an attempt to help her growth. Once off growth hormone she gained 8 pounds in 3 months. She had an irregular menstrual cycle.    She has had extensive physical occupational and speech therapy since she was a toddler. She has a prominent nose and forehead, slightly receding chin line, and short stature. As a young child she  had severe dysarthria to the point of being unintelligible.    She had problems with mood, anxiety, difficulty sleeping, constipation, environmental allergies, and acne.   Behavior History labile mood, and anxiety, obsessive-compulsive behavior  Surgical history: Past Surgical History:  Procedure Laterality Date   Ear Surgeries Right 03/2012   reconstruction of  ear drum    ingrown toenails Left      Family history: family history includes Breast cancer in her maternal grandmother; Lupus in her mother; Other in her mother; Scleroderma in her mother.   Social history: Social History   Socioeconomic History   Marital status: Single    Spouse name: Not on file   Number of children: Not on file   Years of education: Not on file   Highest education level: Not on file  Occupational History   Not on file  Tobacco Use   Smoking status: Never    Passive exposure: Never   Smokeless tobacco: Never  Vaping Use   Vaping Use: Never used  Substance and Sexual Activity   Alcohol use: No   Drug use: No   Sexual activity: Never  Other Topics Concern   Not on file  Social History Narrative   Chandelle is a 31 yo female.   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   Social Determinants of Health   Financial Resource Strain: Not on file  Food Insecurity: Not on file  Transportation Needs: Not on file  Physical Activity: Not on file  Stress: Not on file  Social Connections: Not on file  Intimate Partner Violence: Not on file   Past/failed meds:   Allergies: Allergies  Allergen Reactions   Sulfa Antibiotics Hives    Immunizations:  There is no immunization history on file for this patient.    Diagnostics/Screenings:  Physical Exam: BP 114/68   Pulse 80   Resp 16   Wt 127 lb 6.8 oz (57.8 kg)   BMI 28.06 kg/m   General: Well developed, well nourished young woman, seated on exam table, in no evident distress Head: Head normocephalic and atraumatic.  Oropharynx benign. Neck: Supple Cardiovascular: Regular rate and rhythm, no murmurs Respiratory: Breath sounds clear to auscultation Musculoskeletal: No obvious deformities or scoliosis Skin: No rashes or neurocutaneous lesions  Neurologic Exam Mental Status: Awake and fully alert.  Oriented to place and time.  Recent and remote memory intact.  Attention span  and concentration appropriate.  Fund of knowledge subnormal for age.Needed some help from her mother to supply information. She has mild dysarthria. Mood and affect appropriate. Cranial Nerves: Fundoscopic exam reveals sharp disc margins.  Pupils equal, briskly reactive to light.  Extraocular movements full without nystagmus. Facial sensation intact.  Face tongue, palate move normally and symmetrically.  Motor: Normal bulk and tone. Normal strength in all tested extremity muscles. Sensory: Intact to touch and temperature in all extremities.  Coordination: Rapid alternating movements normal in all extremities.  Finger-to-nose and heel-to shin performed accurately bilaterally.  Romberg negative. Gait and Station: Arises from chair without difficulty.  Stance is normal. Gait demonstrates normal stride length and balance.   Able to heel, toe and tandem walk without difficulty. Reflexes: 1+ and symmetric. Toes downgoing.   Impression: Migraine without aura and without status migrainosus, not intractable  Recurrent occipital headache  Dizziness  Deafness, sensorineural  Mild intellectual disability  Episodic tension-type headache, not intractable  Dysarthria   Recommendations for plan of care: The patient's previous Epic  records were reviewed. Mackenzie Knight has neither had nor required imaging or lab studies since the last visit. She has had improvement in headache frequency and severity since starting Propranolol. She continues to experience dizziness and I talked with Mackenzie Knight about drinking more fluids and having salty snacks during the day. She has an MRI of the brain scheduled later this month. I will call her mother when I receive the results and update her treatment plan at that time. Mackenzie Knight and her mother agreed with the plans made today.  The medication list was reviewed and reconciled. No changes were made in the prescribed medications today. A complete medication list was provided to the  patient.  Allergies as of 07/25/2021       Reactions   Sulfa Antibiotics Hives   Pseudoephedrine Other (See Comments)   Sudafed causes aggitation        Medication List        Accurate as of July 25, 2021 11:59 PM. If you have any questions, ask your nurse or doctor.          ACIDOPHILUS HIGH-POTENCY PO Take by mouth daily. Takes 2 tablet daily.   BIOTIN FORTE PO Take by mouth daily.   clindamycin-benzoyl peroxide gel Commonly known as: BENZACLIN Apply 1 application. topically every morning.   drospirenone-ethinyl estradiol 3-0.02 MG tablet Commonly known as: YAZ Take by mouth.   propranolol 10 MG tablet Commonly known as: INDERAL Take 1 tablet at bedtime for 1 week, then take 2 tablets at bedtime after that   spironolactone 50 MG tablet Commonly known as: ALDACTONE Take 50 mg by mouth daily.   vitamin C 100 MG tablet Take 100 mg by mouth daily.   Vitamin D-1000 Max St 25 MCG (1000 UT) tablet Generic drug: Cholecalciferol Take by mouth.      Total time spent with the patient was 20 minutes, of which 50% or more was spent in counseling and coordination of care.  Rockwell Germany NP-C [facility] Ph. [phone] Fax [phone]

## 2021-07-25 ENCOUNTER — Ambulatory Visit (INDEPENDENT_AMBULATORY_CARE_PROVIDER_SITE_OTHER): Payer: Medicare Other | Admitting: Family

## 2021-07-25 ENCOUNTER — Encounter (INDEPENDENT_AMBULATORY_CARE_PROVIDER_SITE_OTHER): Payer: Self-pay | Admitting: Family

## 2021-07-25 VITALS — BP 114/68 | HR 80 | Resp 16 | Wt 127.4 lb

## 2021-07-25 DIAGNOSIS — G43009 Migraine without aura, not intractable, without status migrainosus: Secondary | ICD-10-CM | POA: Diagnosis not present

## 2021-07-25 DIAGNOSIS — F70 Mild intellectual disabilities: Secondary | ICD-10-CM

## 2021-07-25 DIAGNOSIS — H905 Unspecified sensorineural hearing loss: Secondary | ICD-10-CM

## 2021-07-25 DIAGNOSIS — G44219 Episodic tension-type headache, not intractable: Secondary | ICD-10-CM

## 2021-07-25 DIAGNOSIS — R42 Dizziness and giddiness: Secondary | ICD-10-CM

## 2021-07-25 DIAGNOSIS — R519 Headache, unspecified: Secondary | ICD-10-CM

## 2021-07-25 DIAGNOSIS — R471 Dysarthria and anarthria: Secondary | ICD-10-CM

## 2021-07-25 NOTE — Patient Instructions (Signed)
It was a pleasure to see you today!  Instructions for you until your next appointment are as follows: Continue taking the Propranolol as prescribed for now Try drinking a sports drink during the day and having some salty snacks to help with dizziness, as your blood pressure is likely a little low when you feel dizzy.  I will call you when I receive the MRI report later this month Please sign up for MyChart if you have not done so.   Feel free to contact our office during normal business hours at [phone] with questions or concerns. If there is no answer or the call is outside business hours, please leave a message and our clinic staff will call you back within the next business day.  If you have an urgent concern, please stay on the line for our after-hours answering service and ask for the on-call neurologist.     I also encourage you to use MyChart to communicate with me more directly. If you have not yet signed up for MyChart within [facility], the front desk staff can help you. However, please note that this inbox is NOT monitored on nights or weekends, and response can take up to 2 business days.  Urgent matters should be discussed with the on-call pediatric neurologist.   At [facility], we are committed to providing exceptional care. You will receive a patient satisfaction survey through text or email regarding your visit today. Your opinion is important to me. Comments are appreciated.

## 2021-07-28 ENCOUNTER — Encounter (INDEPENDENT_AMBULATORY_CARE_PROVIDER_SITE_OTHER): Payer: Self-pay | Admitting: Family

## 2021-08-06 ENCOUNTER — Ambulatory Visit (HOSPITAL_COMMUNITY): Payer: BLUE CROSS/BLUE SHIELD

## 2021-08-07 ENCOUNTER — Other Ambulatory Visit: Payer: Self-pay

## 2021-08-07 ENCOUNTER — Encounter (HOSPITAL_COMMUNITY): Payer: Self-pay | Admitting: *Deleted

## 2021-08-07 NOTE — Progress Notes (Addendum)
PCP - Dr London Pepper ENT - Pietro Cassis, PA-C Cardiologist - n/a PEDS Neurology - Rockwell Germany, NP  Chest x-ray - n/a EKG - n/a Stress Test - n/a ECHO - n/a Cardiac Cath - n/a  ICD Pacemaker/Loop - n/a  Sleep Study -  n/a CPAP - none  Anesthesia review: Yes  STOP now taking any Aspirin (unless otherwise instructed by your surgeon), Aleve, Naproxen, Ibuprofen, Motrin, Advil, Goody's, BC's, all herbal medications, fish oil, and all vitamins.   Coronavirus Screening Does the patient have any of the following symptoms:  Cough yes/no: No Fever (>100.78F)  yes/no: No Runny nose yes/no: No Sore throat yes/no: No Difficulty breathing/shortness of breath  yes/no: No  Have you traveled in the last 14 days and where? yes/no: No  Mother Vaughan Basta understanding of instructions that were given via phone.

## 2021-08-07 NOTE — Progress Notes (Signed)
Anesthesia Chart Review: Kathleene Hazel  Case: [id number] Date/Time: 08/08/21 0945   Procedure: MRI WITH BRAIN WITHOUT CONTRAST   Anesthesia type: General   Pre-op diagnosis: RECURRENT OCCIPIPAL HEADACHE,DIZZINESS,DEAFNESS,SENSORINUAL, HEADACHE CHRONIC NEW FEATURES OR INCREASED FREQUENCY   Location: [facility] OR RADIOLOGY ROOM / [facility]   Surgeons: Radiologist, Medication, MD       DISCUSSION: Patient is a 31 year old female scheduled for the above procedure. Neurology Rockwell Germany, NP) ordered brain MRI due to dizziness and more frequent headaches, although some improvement in headaches after starting propranolol. H&P 07/25/21.   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  VS: Ht '4\' 9"'$  (1.448 m)   Wt 65.8 kg   LMP  (LMP Unknown)   BMI 31.39 kg/m  BP Readings from Last 3 Encounters:  07/25/21 114/68  05/22/21 114/68  05/18/20 100/68   Pulse Readings from Last 3 Encounters:  07/25/21 80  05/18/20 76  06/05/17 80     PROVIDERS: London Pepper, MD is PCP  Rockwell Germany, NP is neurology provider [place], Remo Lipps is ENT provider   LABS: For day of procedure as indicated. CMP on 12/14/20 ([facility]) was normal.    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    Ascorbic Acid (VITAMIN C) 1000 MG tablet   BIOTIN PO   Calcium Carbonate-Vitamin D (CALCIUM 600+D PO)   cetirizine (ZYRTEC) 10 MG tablet   clindamycin-benzoyl peroxide (BENZACLIN) gel   drospirenone-ethinyl estradiol (YAZ,GIANVI,LORYNA) 3-0.02 MG tablet   fluticasone (FLONASE) 50 MCG/ACT nasal spray   ibuprofen (ADVIL) 200 MG tablet   Multiple Vitamins-Minerals (ZINC PO)   naproxen (NAPROSYN) 500 MG tablet   Probiotic Product (ACIDOPHILUS HIGH-POTENCY PO)   propranolol (INDERAL) 10 MG tablet   spironolactone (ALDACTONE) 50 MG tablet   Vitamin D, Cholecalciferol, 10 MCG (400 UNIT) CAPS    AMyra Gianotti PA-C Surgical Short Stay/Anesthesiology [facility]aryPhone [phone] PcPhone [phone]/26/2023 10:20 AM

## 2021-08-07 NOTE — Anesthesia Preprocedure Evaluation (Addendum)
Anesthesia Evaluation Anesthesia Physical Anesthesia Plan  ASA:   Anesthesia Plan:    Post-op Pain Management:    Induction:   PONV Risk Score and Plan:   Airway Management Planned:   Additional Equipment:   Intra-op Plan:   Post-operative Plan:   Informed Consent:   Plan Discussed with:   Anesthesia Plan Comments: (PAT note written 08/07/2021 by Myra Gianotti, PA-C. )        Anesthesia Quick Evaluation

## 2021-08-08 ENCOUNTER — Ambulatory Visit (HOSPITAL_COMMUNITY)
Admission: RE | Admit: 2021-08-08 | Discharge: 2021-08-08 | Disposition: A | Discharge status: Home / Self Care | Payer: Medicare Other | Attending: Family | Admitting: Family

## 2021-08-08 ENCOUNTER — Encounter (HOSPITAL_COMMUNITY)
Admission: RE | Disposition: A | Discharge status: Home / Self Care | Payer: Self-pay | Source: Home / Self Care | Attending: Family

## 2021-08-08 ENCOUNTER — Other Ambulatory Visit: Payer: Self-pay

## 2021-08-08 ENCOUNTER — Encounter (HOSPITAL_COMMUNITY): Payer: Self-pay | Admitting: Family

## 2021-08-08 ENCOUNTER — Ambulatory Visit (HOSPITAL_COMMUNITY): Payer: Medicare Other | Admitting: Vascular Surgery

## 2021-08-08 ENCOUNTER — Ambulatory Visit (HOSPITAL_COMMUNITY)
Admission: RE | Admit: 2021-08-08 | Discharge: 2021-08-08 | Disposition: A | Discharge status: Home / Self Care | Payer: Medicare Other | Source: Ambulatory Visit | Attending: Family | Admitting: Family

## 2021-08-08 ENCOUNTER — Ambulatory Visit (HOSPITAL_BASED_OUTPATIENT_CLINIC_OR_DEPARTMENT_OTHER): Payer: Medicare Other | Admitting: Vascular Surgery

## 2021-08-08 DIAGNOSIS — H905 Unspecified sensorineural hearing loss: Secondary | ICD-10-CM | POA: Diagnosis not present

## 2021-08-08 DIAGNOSIS — R519 Headache, unspecified: Secondary | ICD-10-CM

## 2021-08-08 DIAGNOSIS — H903 Sensorineural hearing loss, bilateral: Secondary | ICD-10-CM

## 2021-08-08 DIAGNOSIS — R42 Dizziness and giddiness: Secondary | ICD-10-CM

## 2021-08-08 DIAGNOSIS — G3184 Mild cognitive impairment, so stated: Secondary | ICD-10-CM | POA: Diagnosis not present

## 2021-08-08 DIAGNOSIS — R6250 Unspecified lack of expected normal physiological development in childhood: Secondary | ICD-10-CM | POA: Insufficient documentation

## 2021-08-08 HISTORY — DX: Cardiac arrhythmia, unspecified: I49.9

## 2021-08-08 HISTORY — DX: Attention-deficit hyperactivity disorder, unspecified type: F90.9

## 2021-08-08 HISTORY — DX: Anxiety disorder, unspecified: F41.9

## 2021-08-08 HISTORY — DX: Simple febrile convulsions: R56.00

## 2021-08-08 HISTORY — DX: Unspecified lack of expected normal physiological development in childhood: R62.50

## 2021-08-08 HISTORY — PX: RADIOLOGY WITH ANESTHESIA: SHX6223

## 2021-08-08 HISTORY — DX: Unspecified intellectual disabilities: F79

## 2021-08-08 HISTORY — DX: Gastro-esophageal reflux disease without esophagitis: K21.9

## 2021-08-08 HISTORY — DX: Personal history of other diseases of the nervous system and sense organs: Z86.69

## 2021-08-08 HISTORY — DX: Other seasonal allergic rhinitis: J30.2

## 2021-08-08 HISTORY — DX: Apraxia: R48.2

## 2021-08-08 HISTORY — DX: Headache, unspecified: R51.9

## 2021-08-08 HISTORY — DX: Dizziness and giddiness: R42

## 2021-08-08 LAB — BASIC METABOLIC PANEL
Anion gap: 9 (ref 5–15)
BUN: 11 mg/dL (ref 6–20)
CO2: 26 mmol/L (ref 22–32)
Calcium: 9.4 mg/dL (ref 8.9–10.3)
Chloride: 104 mmol/L (ref 98–111)
Creatinine, Ser: 0.59 mg/dL (ref 0.44–1.00)
GFR, Estimated: >60 mL/min (ref >60)
Glucose, Bld: 92 mg/dL (ref 70–99)
Potassium: 4.1 mmol/L (ref 3.5–5.1)
Sodium: 139 mmol/L (ref 135–145)

## 2021-08-08 LAB — POCT PREGNANCY, URINE: Preg Test, Ur: NEGATIVE

## 2021-08-08 SURGERY — MRI WITH ANESTHESIA
Anesthesia: General

## 2021-08-08 MED ORDER — PROPOFOL 10 MG/ML IV BOLUS
INTRAVENOUS | Status: DC | PRN
  Administered 2021-08-08: 110 mg via INTRAVENOUS
  Administered 2021-08-08: 40 mg via INTRAVENOUS

## 2021-08-08 MED ORDER — ORAL CARE MOUTH RINSE: 15.0000 mL | Freq: Once | OROMUCOSAL | Status: AC

## 2021-08-08 MED ORDER — CHLORHEXIDINE GLUCONATE 0.12 % MT SOLN
15.0000 mL | Freq: Once | OROMUCOSAL | Status: AC
  Administered 2021-08-08: 15 mL via OROMUCOSAL
  Filled 2021-08-08: qty 15

## 2021-08-08 MED ORDER — LIDOCAINE 2% (20 MG/ML) 5 ML SYRINGE
INTRAMUSCULAR | Status: DC | PRN
  Administered 2021-08-08: 40 mg via INTRAVENOUS

## 2021-08-08 MED ORDER — LACTATED RINGERS IV SOLN
INTRAVENOUS | Status: DC
Start: 2021-08-08 — End: 2021-08-08

## 2021-08-08 MED ORDER — ONDANSETRON HCL 4 MG/2ML IJ SOLN
INTRAMUSCULAR | Status: DC | PRN
  Administered 2021-08-08: 4 mg via INTRAVENOUS

## 2021-08-08 NOTE — Anesthesia Procedure Notes (Signed)
Procedure Name: LMA Insertion Date/Time: 08/08/2021 11:03 AM  Performed by: Moshe Salisbury, CRNAPre-anesthesia Checklist: Patient identified, Emergency Drugs available, Suction available and Patient being monitored Patient Re-evaluated:Patient Re-evaluated prior to induction Oxygen Delivery Method: Circle System Utilized Preoxygenation: Pre-oxygenation with 100% oxygen Induction Type: IV induction Ventilation: Mask ventilation without difficulty LMA: LMA inserted LMA Size: 4.0 Number of attempts: 1 Placement Confirmation: positive ETCO2 Tube secured with: Tape Dental Injury: Teeth and Oropharynx as per pre-operative assessment

## 2021-08-08 NOTE — Transfer of Care (Signed)
Immediate Anesthesia Transfer of Care Note  Patient: Mackenzie Knight  Procedure(s) Performed: MRI WITH BRAIN WITHOUT CONTRAST  Patient Location: PACU  Anesthesia Type:General  Level of Consciousness: drowsy and patient cooperative  Airway & Oxygen Therapy: Patient Spontanous Breathing  Post-op Assessment: Report given to RN, Post -op Vital signs reviewed and stable and Patient moving all extremities  Post vital signs: Reviewed and stable  Last Vitals:  Vitals Value Taken Time  BP    Temp    Pulse 76 08/08/21 1136  Resp 11 08/08/21 1136  SpO2 97 % 08/08/21 1136  Vitals shown include unvalidated device data.  Last Pain:  Vitals:   08/08/21 0801  TempSrc:   PainSc: 6       Patients Stated Pain Goal: 0 (33/17/40 9927)  Complications: No notable events documented.

## 2021-08-09 ENCOUNTER — Other Ambulatory Visit (INDEPENDENT_AMBULATORY_CARE_PROVIDER_SITE_OTHER): Payer: Self-pay | Admitting: Family

## 2021-08-09 ENCOUNTER — Encounter (HOSPITAL_COMMUNITY): Payer: Self-pay | Admitting: Radiology

## 2021-08-09 DIAGNOSIS — G43009 Migraine without aura, not intractable, without status migrainosus: Secondary | ICD-10-CM

## 2021-08-09 MED ORDER — PROPRANOLOL HCL 10 MG PO TABS: ORAL_TABLET | ORAL | 1 refills | Status: DC

## 2021-08-09 NOTE — Anesthesia Postprocedure Evaluation (Signed)
Anesthesia Post Note  Patient: Mackenzie Knight  Procedure(s) Performed: MRI WITH BRAIN WITHOUT CONTRAST     Patient location during evaluation: PACU Anesthesia Type: General Level of consciousness: awake and alert Pain management: pain level controlled Vital Signs Assessment: post-procedure vital signs reviewed and stable Respiratory status: spontaneous breathing, nonlabored ventilation and respiratory function stable Cardiovascular status: blood pressure returned to baseline and stable Postop Assessment: no apparent nausea or vomiting Anesthetic complications: no   No notable events documented.  Last Vitals:  Vitals:   08/08/21 1155 08/08/21 1210  BP: 116/82 115/76  Pulse:  97  Resp:    Temp:  36.6 C  SpO2:      Last Pain:  Vitals:   08/08/21 1210  TempSrc:   PainSc: 0-No pain   Pain Goal: Patients Stated Pain Goal: 0 (08/08/21 0801)                 Rubyann Lingle

## 2021-08-16 ENCOUNTER — Encounter (INDEPENDENT_AMBULATORY_CARE_PROVIDER_SITE_OTHER): Payer: Self-pay

## 2021-08-16 ENCOUNTER — Telehealth (INDEPENDENT_AMBULATORY_CARE_PROVIDER_SITE_OTHER): Payer: Self-pay | Admitting: Family

## 2021-08-16 DIAGNOSIS — F4323 Adjustment disorder with mixed anxiety and depressed mood: Secondary | ICD-10-CM

## 2021-08-16 MED ORDER — SERTRALINE HCL 25 MG PO TABS: 25.0000 mg | ORAL_TABLET | Freq: Every day | ORAL | 1 refills | Status: DC

## 2021-08-16 NOTE — Telephone Encounter (Signed)
Mom called back. She said that Mackenzie Knight was having episodes of crying and being upset every day. She has had problems with obsessive behaviors in the past and they are surfacing again. She has reported being upset by her sister getting married and fears she will never see her again, as well as other changes in the family. Mackenzie Knight had counseling in the past and I recommended that she return. I also recommended that she start Sertraline once per day. I explained to Mom that it should be taken at the same time each day, with food and that it will take about 2 weeks to see any improvement in mood. I recommended reducing the Propranolol to just the bedtime dose. Finally I asked Mom to call in 2 weeks to report on how Mackenzie Knight is doing. She agreed with these plans. TG

## 2021-08-16 NOTE — Telephone Encounter (Signed)
I left a message and invited Mom to call back. TG

## 2021-08-16 NOTE — Telephone Encounter (Signed)
Unable to see a DPR on file to speak with mom or guardianship paperwork- my chart sent to patient with DPR attached for patient to complete

## 2021-08-16 NOTE — Telephone Encounter (Signed)
  Name of who is calling:Mackenzie Knight   Caller's Relationship to Patient:Mother   Best contact number:[phone]   Provider they IDX:[facility]   Reason for call:requested a call back with questions regarding the medication Propranolol. Mom stated that Mackenzie Knight is struggling with depression and her anxiety has got out of control and her mood has completley changed all of these things are associated with headaches. Please advise      PRESCRIPTION REFILL ONLY  Name of prescription:  Pharmacy:

## 2021-08-27 DIAGNOSIS — J329 Chronic sinusitis, unspecified: Secondary | ICD-10-CM | POA: Diagnosis not present

## 2021-09-11 DIAGNOSIS — R600 Localized edema: Secondary | ICD-10-CM | POA: Diagnosis not present

## 2021-09-11 DIAGNOSIS — J342 Deviated nasal septum: Secondary | ICD-10-CM | POA: Diagnosis not present

## 2021-09-11 DIAGNOSIS — J329 Chronic sinusitis, unspecified: Secondary | ICD-10-CM | POA: Diagnosis not present

## 2021-10-09 ENCOUNTER — Other Ambulatory Visit (INDEPENDENT_AMBULATORY_CARE_PROVIDER_SITE_OTHER): Payer: Self-pay | Admitting: Family

## 2021-10-09 DIAGNOSIS — F4323 Adjustment disorder with mixed anxiety and depressed mood: Secondary | ICD-10-CM

## 2021-10-10 ENCOUNTER — Telehealth (INDEPENDENT_AMBULATORY_CARE_PROVIDER_SITE_OTHER): Payer: Self-pay | Admitting: Family

## 2021-10-10 DIAGNOSIS — F4323 Adjustment disorder with mixed anxiety and depressed mood: Secondary | ICD-10-CM

## 2021-10-10 DIAGNOSIS — G43009 Migraine without aura, not intractable, without status migrainosus: Secondary | ICD-10-CM

## 2021-10-10 MED ORDER — SERTRALINE HCL 25 MG PO TABS: 37.5000 mg | ORAL_TABLET | Freq: Every day | ORAL | 5 refills | Status: DC

## 2021-10-10 MED ORDER — EMGALITY 120 MG/ML ~~LOC~~ SOAJ: 240.0000 mg | Freq: Once | SUBCUTANEOUS | 0 refills | Status: AC

## 2021-10-10 NOTE — Telephone Encounter (Signed)
  Name of who is calling: Armstead Peaks Relationship to Patient: Mom  Best contact number: [phone]  Provider they see: Rockwell Germany  Reason for call: mom Is asking for a call in regards to [name]'s medication.

## 2021-10-10 NOTE — Telephone Encounter (Signed)
I called and spoke with Mom. She said that [name]'s headaches have worsened again and she is interested in trying other medications. I talked with her about options and she decided to try Emgality as well as increase the Sertraline dose. I asked Mom to let me know when she receives the [facility] from the pharmacy so she can come in to learn how to do the injections. I also asked her to call me in 2 weeks to let me know how Mackenzie Knight was doing with the Sertraline increase. TG

## 2021-11-01 ENCOUNTER — Telehealth (INDEPENDENT_AMBULATORY_CARE_PROVIDER_SITE_OTHER): Payer: Self-pay | Admitting: Family

## 2021-11-01 DIAGNOSIS — G43009 Migraine without aura, not intractable, without status migrainosus: Secondary | ICD-10-CM

## 2021-11-01 MED ORDER — PROPRANOLOL HCL 10 MG PO TABS: ORAL_TABLET | ORAL | 5 refills | Status: DC

## 2021-11-01 NOTE — Telephone Encounter (Signed)
  Name of who is calling:Brian / Zenia Resides   Caller's Relationship to Patient:Father   Best contact number:[phone] / [phone]   Provider they ERX:[facility]   Reason for call:Dad called stating that [facility] is going out of town next week and does not have enough medication to last and asked if a refill could be sent to [facility]  Name of [place]

## 2021-11-01 NOTE — Telephone Encounter (Signed)
Please let Dad know that the refill was sent in. Thanks, [name]

## 2021-11-15 ENCOUNTER — Telehealth (INDEPENDENT_AMBULATORY_CARE_PROVIDER_SITE_OTHER): Payer: Self-pay | Admitting: Family

## 2021-11-15 NOTE — Telephone Encounter (Signed)
  Name of who is calling: Armstead Peaks Relationship to Patient: Mom  Best contact number: [phone]  Provider they see: Rockwell Germany  Reason for call: Mom called In to follow up on the note that was placed this morning. She is requesting a callback.      PRESCRIPTION REFILL ONLY  Name of prescription:  Pharmacy:

## 2021-11-15 NOTE — Telephone Encounter (Signed)
Mom is returning your call.

## 2021-11-15 NOTE — Telephone Encounter (Signed)
  Name of who is calling: Armstead Peaks Relationship to Patient: mom   Best contact number: [phone]  Provider they see: Rockwell Germany   Reason for call: Mom is asking for a call back. She is stating she needs to schedule some type of an appt to learn how to give injections for [facility].

## 2021-11-15 NOTE — Telephone Encounter (Signed)
I called and left a message. I will continue to try to reach Mom. TG

## 2021-11-15 NOTE — Telephone Encounter (Signed)
I left a message for Mom and will try to reach her later today. TG

## 2021-11-19 NOTE — Telephone Encounter (Signed)
I called and arranged to do the training Weds Nov 15th at 2:00PM. TG

## 2021-11-20 NOTE — Telephone Encounter (Signed)
See phone note from yesterday

## 2021-11-27 ENCOUNTER — Encounter (INDEPENDENT_AMBULATORY_CARE_PROVIDER_SITE_OTHER): Payer: Self-pay | Admitting: Family

## 2021-11-27 ENCOUNTER — Ambulatory Visit (INDEPENDENT_AMBULATORY_CARE_PROVIDER_SITE_OTHER): Payer: Medicare Other | Admitting: Family

## 2021-11-27 VITALS — BP 102/72 | HR 80 | Ht <= 58 in | Wt 120.2 lb

## 2021-11-27 DIAGNOSIS — G43009 Migraine without aura, not intractable, without status migrainosus: Secondary | ICD-10-CM | POA: Diagnosis not present

## 2021-11-27 MED ORDER — EMGALITY 120 MG/ML ~~LOC~~ SOAJ: 120.0000 mg | SUBCUTANEOUS | 2 refills | Status: DC

## 2021-11-27 MED ORDER — GALCANEZUMAB-GNLM 120 MG/ML ~~LOC~~ SOAJ
120.0000 mg | Freq: Once | SUBCUTANEOUS | Status: AC
  Administered 2021-11-27: 120 mg via SUBCUTANEOUS

## 2021-11-27 NOTE — Patient Instructions (Signed)
It was a pleasure to see you today!  Instructions for you until your next appointment are as follows: Give the Emgality injection once every 30 days Keep track of headache so we can determine if the headaches have improved Please sign up for MyChart if you have not done so. Call me in 3 months to let me know how things are going.   Feel free to contact our office during normal business hours at [phone] with questions or concerns. If there is no answer or the call is outside business hours, please leave a message and our clinic staff will call you back within the next business day.  If you have an urgent concern, please stay on the line for our after-hours answering service and ask for the on-call neurologist.     I also encourage you to use MyChart to communicate with me more directly. If you have not yet signed up for MyChart within [facility], the front desk staff can help you. However, please note that this inbox is NOT monitored on nights or weekends, and response can take up to 2 business days.  Urgent matters should be discussed with the on-call pediatric neurologist.   At [facility], we are committed to providing exceptional care. You will receive a patient satisfaction survey through text or email regarding your visit today. Your opinion is important to me. Comments are appreciated.

## 2021-11-27 NOTE — Progress Notes (Addendum)
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 She has been experiencing headaches and dizziness since about February or March, 2023   Today's concerns: Mackenzie Knight is seen today in order to receive instruction on self-administration of Emgality for migraine headaches. She reports today that she has noticed that headaches are worse when she is tired and near her menstrual cycle.   Mackenzie Knight has been otherwise generally healthy since she was last seen. Neither she nor her mother have other health concerns for her today other than previously mentioned.  Review of systems: Please see HPI for neurologic and other pertinent review of systems. Otherwise all other systems were reviewed and were negative.  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   Adjustment disorder with mixed disturbance of emotions and conduct 08/10/2014   Episodic tension type headache 08/10/2014   Fatigue 08/09/2014   Dizziness 07/20/2013   Deafness, sensorineural 09/01/2012   Obsessive-compulsive disorder 05/25/2012   Attention deficit disorder 05/25/2012   Lack of coordination 05/25/2012   Dysarthria 05/25/2012   Apraxia 05/25/2012   Conductive hearing loss, middle ear 12/23/2011     Past Medical History:  Diagnosis Date   ADHD (attention deficit hyperactivity disorder)    Anxiety    Developmental delay    Dizziness    since Feb/March 2023   Dysrhythmia     Febrile seizures ([place])    as a child, none since 69yr old, no current problem   GERD (gastroesophageal reflux disease)    no meds, diet   Headache    Hearing loss    sensorineural hearing loss   Hx of migraine headaches    since Feb/March 2023, HA treated with propanolol   Intellectual disability    mild   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 [facility], she has had more hearing loss recently in the right ear.    Her highest developmental quotient as a toddler was 746 When last tested before age [age]it was 549 In March 1996 the patient had episodes of unresponsive staring. EEG at 18 months was normal in the waking state. She 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 No further workup was recommended. She has not had any imaging studies of her brain nor chromosomal studies.    [facility] Once off growth hormone she gained 8 pounds in 3 months. She had an irregular menstrual cycle.  She has had extensive physical occupational and speech therapy since she was a toddler. She has a prominent nose and forehead, slightly receding chin line, and short stature. As a young child she had severe dysarthria to the point of being unintelligible.    She had problems with mood, anxiety, difficulty sleeping, constipation, environmental allergies, and acne.   Behavior History labile mood, and anxiety, obsessive-compulsive  behavior  Surgical history: Past Surgical History:  Procedure Laterality Date   BREAST LUMPECTOMY Right 02/07/2021   Ear Surgeries Right 03/13/2012   reconstruction of ear drum    ingrown toenails Left    RADIOLOGY WITH ANESTHESIA N/A 08/08/2021   Procedure: MRI WITH BRAIN WITHOUT CONTRAST;  Surgeon: Radiologist, Medication, MD;  Location: [facility];  Service: Radiology;  Laterality: N/A;   TYMPANOPLASTY WO MASTOIDEC; WO OSSICULAR CHAIN  04/23/2012     Family history: family history includes Breast cancer in her maternal grandmother; Lupus in her mother; Other in her mother; Scleroderma in her mother.   Social history: Social History   Socioeconomic History   Marital status: Single    Spouse name: Not on file   Number of children: Not on file   Years of education: Not on file   Highest education level: Not on file  Occupational History   Not on file  Tobacco Use   Smoking status: Never    Passive exposure: Never   Smokeless tobacco: Never  Vaping Use   Vaping Use: Never used  Substance and Sexual Activity   Alcohol use: No   Drug use: No   Sexual activity: Never    Birth control/protection: Pill  Other Topics Concern   Not on file  Social History Narrative   Mackenzie Knight is a 31 yo female.   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   Social Determinants of Health   Financial Resource Strain: Not on file  Food Insecurity: Not on file  Transportation Needs: Not on file  Physical Activity: Not on file  Stress: Not on file  Social Connections: Not on file  Intimate Partner Violence: Not on file    Past/failed meds:  Allergies: Allergies  Allergen Reactions   Sulfa Antibiotics Hives   Pseudoephedrine Other (See Comments)    Sudafed causes aggitation    Immunizations:  There is no immunization history on file for this patient.   Diagnostics/Screenings: 08/08/2021 - MRI brain wo contrast - Unremarkable non-contrast MRI appearance of the brain.  No evidence of acute intracranial abnormality. Paranasal sinus disease, as described.  Physical Exam: BP 102/72 (BP Location: Left Arm, Patient Position: Sitting, Cuff Size: Small)   Pulse 80   Ht 4' 8.61" (1.438 m)   Wt 120 lb 3.2 oz (54.5 kg)   LMP 11/04/2021 (Approximate)   BMI 26.37 kg/m   There was no examination as Mackenzie Knight was here for instruction for administering injections to herself.  Impression: Migraine without aura and without status migrainosus, not intractable - Plan: Galcanezumab-gnlm SOAJ 120 mg, EMGALITY 120 MG/ML SOAJ   Recommendations for plan of care: The patient's previous Epic records were reviewed. Mackenzie Knight has neither had nor required imaging or lab studies since the last visit. She was seen today to receive instruction on self-administration of Emgality for migraine prophylaxis. I reviewed the procedure with Mackenzie Knight and her mother. Mom gave the injection in the posterior left upper arm and Mackenzie Knight tolerated it well. I reviewed with them that the injection should be given every 30 days  and to continue her medication without change for now. I asked Mom to call me in 3 months to let me know how often Mackenzie Knight is experiencing a headache. I will otherwise see her back in follow up in March 2024 as previously scheduled. She and Mackenzie Knight agreed with the plans made today.   The medication list was reviewed and reconciled. No changes were made in the prescribed medications today. A complete medication list was provided to the patient.  Allergies as of 11/27/2021       Reactions   Sulfa Antibiotics Hives   Pseudoephedrine Other (See Comments)   Sudafed causes aggitation        Medication List        Accurate as of November 27, 2021  3:02 PM. If you have any questions, ask your nurse or doctor.          ACIDOPHILUS HIGH-POTENCY PO Take 2 capsules by mouth daily.   BIOTIN PO Take 6,000 mcg by mouth daily.   CALCIUM 600+D PO Take 1 tablet by mouth daily.    cetirizine 10 MG tablet Commonly known as: ZYRTEC Take 10 mg by mouth daily as needed for allergies.   clindamycin-benzoyl peroxide gel Commonly known as: BENZACLIN Apply 1 application  topically daily as needed (acne).   drospirenone-ethinyl estradiol 3-0.02 MG tablet Commonly known as: YAZ Take 1 tablet by mouth daily.   Emgality 120 MG/ML Soaj Generic drug: Galcanezumab-gnlm Inject 120 mg into the skin every 30 (thirty) days. Start taking on: December 27, 2021 What changed:  how much to take when to take this Changed by: Rockwell Germany, NP   fluticasone 50 MCG/ACT nasal spray Commonly known as: FLONASE Place 1 spray into both nostrils daily as needed for allergies or rhinitis.   ibuprofen 200 MG tablet Commonly known as: ADVIL Take 400 mg by mouth every 6 (six) hours as needed for headache.   naproxen 500 MG tablet Commonly known as: NAPROSYN Take 500 mg by mouth 2 (two) times daily as needed for moderate pain.   propranolol 10 MG tablet Commonly known as: INDERAL Take 1 tablet in the morning and take 2 tablets at bedtime   sertraline 25 MG tablet Commonly known as: ZOLOFT Take 1.5 tablets (37.5 mg total) by mouth daily.   spironolactone 50 MG tablet Commonly known as: ALDACTONE Take 50 mg by mouth daily.   vitamin C 1000 MG tablet Take 1,000 mg by mouth daily.   Vitamin D (Cholecalciferol) 10 MCG (400 UNIT) Caps Take 400 Units by mouth daily.   ZINC PO Take 1 tablet by mouth daily.      Total time spent with the patient was 15 minutes, of which 50% or more was spent in counseling and coordination of care.  Rockwell Germany NP-C [place] [facility] Ph. [phone] Fax [phone]

## 2022-01-22 DIAGNOSIS — J988 Other specified respiratory disorders: Secondary | ICD-10-CM | POA: Diagnosis not present

## 2022-01-22 DIAGNOSIS — R059 Cough, unspecified: Secondary | ICD-10-CM | POA: Diagnosis not present

## 2022-01-28 DIAGNOSIS — L700 Acne vulgaris: Secondary | ICD-10-CM | POA: Diagnosis not present

## 2022-01-28 DIAGNOSIS — Z803 Family history of malignant neoplasm of breast: Secondary | ICD-10-CM | POA: Diagnosis not present

## 2022-01-31 DIAGNOSIS — G9331 Postviral fatigue syndrome: Secondary | ICD-10-CM | POA: Diagnosis not present

## 2022-01-31 DIAGNOSIS — K219 Gastro-esophageal reflux disease without esophagitis: Secondary | ICD-10-CM | POA: Diagnosis not present

## 2022-01-31 DIAGNOSIS — R059 Cough, unspecified: Secondary | ICD-10-CM | POA: Diagnosis not present

## 2022-01-31 DIAGNOSIS — R0982 Postnasal drip: Secondary | ICD-10-CM | POA: Diagnosis not present

## 2022-02-19 DIAGNOSIS — R051 Acute cough: Secondary | ICD-10-CM | POA: Diagnosis not present

## 2022-03-30 ENCOUNTER — Other Ambulatory Visit (INDEPENDENT_AMBULATORY_CARE_PROVIDER_SITE_OTHER): Payer: Self-pay | Admitting: Family

## 2022-03-30 DIAGNOSIS — G43009 Migraine without aura, not intractable, without status migrainosus: Secondary | ICD-10-CM

## 2022-03-31 NOTE — Telephone Encounter (Signed)
Last OV 11/27/2021 Next OV 04/30/2022 Rx written 10/2021 with 5 refills-

## 2022-04-08 ENCOUNTER — Telehealth (INDEPENDENT_AMBULATORY_CARE_PROVIDER_SITE_OTHER): Payer: Self-pay | Admitting: Family

## 2022-04-08 NOTE — Telephone Encounter (Signed)
  Name of who is calling: Jacklyn Shell  Caller's Relationship to Patient: Mother  Best contact [phone]  Provider they see: [facility]  Reason for call: Vaughan Basta called requesting a letter that explains her condition to excuse her from [employer] duty.     PRESCRIPTION REFILL ONLY  Name of prescription:  Pharmacy:

## 2022-04-08 NOTE — Telephone Encounter (Signed)
I called Mom and verified that Mackenzie Knight has been called for jury service and not her mother. Mom will pick up the letter tomorrow. TG

## 2022-04-10 ENCOUNTER — Ambulatory Visit (INDEPENDENT_AMBULATORY_CARE_PROVIDER_SITE_OTHER): Payer: Medicare Other | Admitting: Family

## 2022-04-21 DIAGNOSIS — M79652 Pain in left thigh: Secondary | ICD-10-CM | POA: Diagnosis not present

## 2022-04-28 ENCOUNTER — Other Ambulatory Visit (INDEPENDENT_AMBULATORY_CARE_PROVIDER_SITE_OTHER): Payer: Self-pay | Admitting: Family

## 2022-04-28 DIAGNOSIS — F4323 Adjustment disorder with mixed anxiety and depressed mood: Secondary | ICD-10-CM

## 2022-04-28 NOTE — Telephone Encounter (Signed)
Last OV 11/27/2021 Next OV 04/30/2022 Last Rx written 09/2021- Per Inetta Fermo ok to refill

## 2022-04-30 ENCOUNTER — Encounter (INDEPENDENT_AMBULATORY_CARE_PROVIDER_SITE_OTHER): Payer: Self-pay | Admitting: Family

## 2022-04-30 ENCOUNTER — Ambulatory Visit (INDEPENDENT_AMBULATORY_CARE_PROVIDER_SITE_OTHER): Payer: 59 | Admitting: Family

## 2022-04-30 VITALS — BP 108/70 | HR 76 | Ht <= 58 in | Wt 127.2 lb

## 2022-04-30 DIAGNOSIS — G44221 Chronic tension-type headache, intractable: Secondary | ICD-10-CM

## 2022-04-30 DIAGNOSIS — F424 Excoriation (skin-picking) disorder: Secondary | ICD-10-CM | POA: Diagnosis not present

## 2022-04-30 DIAGNOSIS — R471 Dysarthria and anarthria: Secondary | ICD-10-CM | POA: Diagnosis not present

## 2022-04-30 DIAGNOSIS — F70 Mild intellectual disabilities: Secondary | ICD-10-CM

## 2022-04-30 DIAGNOSIS — H902 Conductive hearing loss, unspecified: Secondary | ICD-10-CM

## 2022-04-30 NOTE — Patient Instructions (Signed)
It was a pleasure to see you today!  Instructions for you until your next appointment are as follows: We will taper and discontinue the Sertraline (Zoloft) as follows: Take 1 tablet per day for 2 weeks, then stop the medication  After you have tapered off the Sertraline, we will begin tapering the Propranolol as follows: Take 1/2 tablet in the morning and 2 tablets at night for 1 week, then  Take 1/2 tablet in the morning and 1+1/2 tablets at night for 1 week, then Take 1/2 tablet  in the morning and 1 tablet at night for 1 week, then Take 1/2 tablet in the morning and 1/2 tablet at night for 1 week, then Take none in the morning and 1/2 tablet at night for 1 week, then Stop the medication  Call me during the taper if headaches worsen. If you do not have problems coming off the medication, and headache stay the same, call me when you are finished with the taper and we will make a plan for the next medication to try.  Consider trying Magnesium  once per day with meals and/or CoQ10 - take 1 with each meal. These supplements are known to help with reducing headaches Work on exercising every day. Gentle exercise such as walking, swimming, stretching Massage can help with headaches Some essential oils such as peppermint and/or lavender are also known to help with headaches. To use them, rub a small amount on the temples or the forehead Please plan to return for follow up in 6 months or sooner if needed.    Feel free to contact our office during normal business hours at [phone] with questions or concerns. If there is no answer or the call is outside business hours, please leave a message and our clinic staff will call you back within the next business day.  If you have an urgent concern, please stay on the line for our after-hours answering service and ask for the on-call neurologist.     I also encourage you to use MyChart to communicate with me more directly. If you have not yet signed  up for MyChart within [facility], the front desk staff can help you. However, please note that this inbox is NOT monitored on nights or weekends, and response can take up to 2 business days.  Urgent matters should be discussed with the on-call pediatric neurologist.   At [facility], we are committed to providing exceptional care. You will receive a patient satisfaction survey through text or email regarding your visit today. Your opinion is important to me. Comments are appreciated.

## 2022-04-30 NOTE — Progress Notes (Signed)
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 Mackenzie Knight has been experiencing headaches and dizziness since about February or March, 2023   Today's concerns: Reports ongoing near daily headaches and is frustrated with lack of improvement Doesn't feel that Sertraline has been helpful and wants to stop it Reports being tired every day. Wonders if her medication is contributing to fatigue Has stopped Emgality injections as Mackenzie Knight did not find them helpful for headaches Picks at the skin on her fingers. Has several raw places near then nails. Adhya has been otherwise generally healthy since Mackenzie Knight was last seen. No health concerns today other than previously mentioned.  Review of systems: Please see HPI for neurologic and other pertinent review of systems. Otherwise all other systems were reviewed and were negative.  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   Adjustment disorder with mixed disturbance of emotions and conduct 08/10/2014   Episodic tension type headache 08/10/2014   Fatigue 08/09/2014   Dizziness 07/20/2013   Deafness, sensorineural 09/01/2012   Obsessive-compulsive disorder 05/25/2012   Attention deficit disorder 05/25/2012   Lack of coordination 05/25/2012   Dysarthria 05/25/2012   Apraxia 05/25/2012   Conductive hearing loss, middle ear 12/23/2011     Past Medical History:  Diagnosis  Date   ADHD (attention deficit hyperactivity disorder)    Anxiety    Developmental delay    Dizziness    since Feb/March 2023   Dysrhythmia    Febrile seizures    as a child, none since 20yrs old, no current problem   GERD (gastroesophageal reflux disease)    no meds, diet   Headache    Hearing loss    sensorineural hearing loss   Hx of migraine headaches    since Feb/March 2023, HA treated with propanolol   Intellectual disability    mild   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 Dr. Rema Fendt at [facility] wanted to schedual eardrum reconstuction surgery, Mackenzie Knight has had more hearing loss recently in the right ear.    Her highest developmental quotient as a toddler was 38. When last tested before age 89 it was 96. In March 1996 the patient had episodes of unresponsive staring. EEG at 18 months was normal in the waking state. Mackenzie Knight was evaluated by Dr. Ramonita Lab, child neurologist at [facility] 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 No further workup was recommended. Mackenzie Knight has not had any imaging studies of her brain nor chromosomal studies.    Mackenzie Knight was given growth hormone at [facility] in an attempt  to help her growth. Once off growth hormone Mackenzie Knight gained 8 pounds in 3 months. Mackenzie Knight had an irregular menstrual cycle.    Mackenzie Knight has had extensive physical occupational and speech therapy since Mackenzie Knight was a toddler. Mackenzie Knight has a prominent nose and forehead, slightly receding chin line, and short stature. As a young child Mackenzie Knight had severe dysarthria to the point of being unintelligible.    Mackenzie Knight had problems with mood, anxiety,  difficulty sleeping, constipation, environmental allergies, and acne.   Behavior History labile mood, and anxiety, obsessive-compulsive behavior  Surgical history: Past Surgical History:  Procedure Laterality Date   BREAST LUMPECTOMY Right 02/07/2021   Ear Surgeries Right 03/13/2012   reconstruction of ear drum    ingrown toenails Left    RADIOLOGY WITH ANESTHESIA N/A 08/08/2021   Procedure: MRI WITH BRAIN WITHOUT CONTRAST;  Surgeon: Radiologist, Medication, MD;  Location: [facility] OR;  Service: Radiology;  Laterality: N/A;   TYMPANOPLASTY WO MASTOIDEC; WO OSSICULAR CHAIN  04/23/2012     Family history: family history includes Breast cancer in her maternal grandmother; Lupus in her mother; Other in her mother; Scleroderma in her mother.   Social history: Social History   Socioeconomic History   Marital status: Single    Spouse name: Not on file   Number of children: Not on file   Years of education: Not on file   Highest education level: Not on file  Occupational History   Not on file  Tobacco Use   Smoking status: Never    Passive exposure: Never   Smokeless tobacco: Never  Vaping Use   Vaping Use: Never used  Substance and Sexual Activity   Alcohol use: No   Drug use: No   Sexual activity: Never    Birth control/protection: Pill  Other Topics Concern   Not on file  Social History Narrative   Mackenzie Knight is a 32 yo female.   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   Mackenzie Knight lives with her mother.   Social Determinants of Health   Financial Resource Strain: Not on file  Food Insecurity: Not on file  Transportation Needs: Not on file  Physical Activity: Not on file  Stress: Not on file  Social Connections: Not on file  Intimate Partner Violence: Not on file    Past/failed meds:  Allergies: Allergies  Allergen Reactions   Sulfa Antibiotics Hives   Pseudoephedrine Other (See Comments)    Sudafed causes aggitation    Immunizations:  There is no immunization history on file  for this patient.   Diagnostics/Screenings: Copied from previous record: 08/08/2021 - MRI brain wo contrast - Unremarkable non-contrast MRI appearance of the brain. No evidence of acute intracranial abnormality. Paranasal sinus disease, as described.   Physical Exam: Ht 4' 8.5" (1.435 m)   Wt 127 lb 3.2 oz (57.7 kg)   LMP 04/14/2022 (Approximate)   BMI 28.02 kg/m   General: Well developed, well nourished woman, seated on exam table, in no evident distress Head: Head normocephalic and atraumatic.  Oropharynx benign. Neck: Supple Cardiovascular: Regular rate and rhythm, no murmurs Respiratory: Breath sounds clear to auscultation Musculoskeletal: No obvious deformities or scoliosis Skin: No rashes or neurocutaneous lesions. Has excoriation around some of her nails from picking at her skin  Neurologic Exam Mental Status: Awake and fully alert.  Oriented to place and time.  Fund of knowledge subnormal for age.  Mood and affect appropriate. Speech with dysarthria Cranial Nerves: Fundoscopic exam reveals sharp disc margins.  Pupils equal, briskly reactive  to light.  Extraocular movements full without nystagmus. Hearing intact and symmetric to whisper.  Facial sensation intact.  Face tongue, palate move normally and symmetrically. Shoulder shrug normal Motor: Normal bulk and tone. Normal strength in all tested extremity muscles. Sensory: Intact to touch and temperature in all extremities.  Coordination: Rapid alternating movements normal in all extremities.  Finger-to-nose and heel-to shin performed accurately bilaterally.  Romberg negative. Gait and Station: Arises from chair without difficulty.  Stance is normal. Gait demonstrates normal stride length and balance.   Able to heel, toe and tandem walk without difficulty. Reflexes: 1+ and symmetric. Toes downgoing.   Impression: Chronic tension-type headache, intractable  Mild intellectual disability  Excoriation (skin-picking)  disorder  Dysarthria  Conductive hearing loss, middle ear   Recommendations for plan of care: The patient's previous Epic records were reviewed. No recent diagnostic studies to be reviewed with the patient. I talked with Alan Ripper and her mother about chronic tension headaches and explained that they can be difficult to resolve. Plan until next visit: Will taper and discontinue Sertaline, then Propranolol Recommended Magnesium and CoQ10 supplements Recommended regular exercise program Reviewed other non-pharmacological treatments such as massage and essential oils Call if headaches worsen after coming off the Sertraline and/or Propranolol. If that occurs, we may try Amitriptyline Return in about 6 months (around 10/30/2022).  The medication list was reviewed and reconciled. No changes were made in the prescribed medications today. A complete medication list was provided to the patient.  Allergies as of 04/30/2022       Reactions   Sulfa Antibiotics Hives   Pseudoephedrine Other (See Comments)   Sudafed causes aggitation        Medication List        Accurate as of April 30, 2022 11:59 PM. If you have any questions, ask your nurse or doctor.          ACIDOPHILUS HIGH-POTENCY PO Take 2 capsules by mouth daily.   BIOTIN PO Take 6,000 mcg by mouth daily.   CALCIUM 600+D PO Take 1 tablet by mouth daily.   cetirizine 10 MG tablet Commonly known as: ZYRTEC Take 10 mg by mouth daily as needed for allergies.   clindamycin-benzoyl peroxide gel Commonly known as: BENZACLIN Apply 1 application  topically daily as needed (acne).   drospirenone-ethinyl estradiol 3-0.02 MG tablet Commonly known as: YAZ Take 1 tablet by mouth daily.   Emgality 120 MG/ML Soaj Generic drug: Galcanezumab-gnlm Inject 120 mg into the skin every 30 (thirty) days.   fluticasone 50 MCG/ACT nasal spray Commonly known as: FLONASE Place 1 spray into both nostrils daily as needed for allergies or  rhinitis.   ibuprofen 200 MG tablet Commonly known as: ADVIL Take 400 mg by mouth every 6 (six) hours as needed for headache.   meloxicam 15 MG tablet Commonly known as: MOBIC Take 1 tablet every day by oral route as needed for 30 days, for pain.   naproxen 500 MG tablet Commonly known as: NAPROSYN Take 500 mg by mouth 2 (two) times daily as needed for moderate pain.   propranolol 10 MG tablet Commonly known as: INDERAL TAKE 1 TABLET BY MOUTH IN THE MORNING TAKE 2 TABLETS BY MOUTH IN THE EVENING   sertraline 25 MG tablet Commonly known as: ZOLOFT Take 1.5 tablets (37.5 mg total) by mouth daily.   spironolactone 50 MG tablet Commonly known as: ALDACTONE Take 50 mg by mouth daily.   vitamin C 1000 MG tablet Take 1,000 mg by mouth  daily.   Vitamin D (Cholecalciferol) 10 MCG (400 UNIT) Caps Take 400 Units by mouth daily.   ZINC PO Take 1 tablet by mouth daily.      Total time spent with the patient was 30 minutes, of which 50% or more was spent in counseling and coordination of care.  Elveria Rising NP-C [facility] [address] [place], [place] [address] Ph. [phone] Fax [phone]

## 2022-05-01 DIAGNOSIS — M79604 Pain in right leg: Secondary | ICD-10-CM | POA: Diagnosis not present

## 2022-05-01 DIAGNOSIS — M79652 Pain in left thigh: Secondary | ICD-10-CM | POA: Diagnosis not present

## 2022-05-02 ENCOUNTER — Encounter (INDEPENDENT_AMBULATORY_CARE_PROVIDER_SITE_OTHER): Payer: Self-pay | Admitting: Family

## 2022-05-02 DIAGNOSIS — G44221 Chronic tension-type headache, intractable: Secondary | ICD-10-CM | POA: Insufficient documentation

## 2022-05-08 DIAGNOSIS — M79652 Pain in left thigh: Secondary | ICD-10-CM | POA: Diagnosis not present

## 2022-07-10 DIAGNOSIS — R09A2 Foreign body sensation, throat: Secondary | ICD-10-CM | POA: Diagnosis not present

## 2022-08-11 DIAGNOSIS — K1379 Other lesions of oral mucosa: Secondary | ICD-10-CM | POA: Diagnosis not present

## 2022-09-24 DIAGNOSIS — H6090 Unspecified otitis externa, unspecified ear: Secondary | ICD-10-CM | POA: Diagnosis not present

## 2022-09-24 DIAGNOSIS — H6690 Otitis media, unspecified, unspecified ear: Secondary | ICD-10-CM | POA: Diagnosis not present

## 2022-10-01 ENCOUNTER — Other Ambulatory Visit (INDEPENDENT_AMBULATORY_CARE_PROVIDER_SITE_OTHER): Payer: Self-pay | Admitting: Family

## 2022-10-01 DIAGNOSIS — H6690 Otitis media, unspecified, unspecified ear: Secondary | ICD-10-CM | POA: Diagnosis not present

## 2022-10-01 DIAGNOSIS — G43009 Migraine without aura, not intractable, without status migrainosus: Secondary | ICD-10-CM

## 2022-10-01 DIAGNOSIS — H938X9 Other specified disorders of ear, unspecified ear: Secondary | ICD-10-CM | POA: Diagnosis not present

## 2022-10-06 DIAGNOSIS — H9203 Otalgia, bilateral: Secondary | ICD-10-CM | POA: Diagnosis not present

## 2022-10-06 DIAGNOSIS — H6121 Impacted cerumen, right ear: Secondary | ICD-10-CM | POA: Diagnosis not present

## 2022-10-27 DIAGNOSIS — H6693 Otitis media, unspecified, bilateral: Secondary | ICD-10-CM | POA: Diagnosis not present

## 2022-11-04 DIAGNOSIS — H9203 Otalgia, bilateral: Secondary | ICD-10-CM | POA: Diagnosis not present

## 2022-11-06 ENCOUNTER — Ambulatory Visit (INDEPENDENT_AMBULATORY_CARE_PROVIDER_SITE_OTHER): Payer: 59 | Admitting: Family

## 2022-11-18 DIAGNOSIS — H6692 Otitis media, unspecified, left ear: Secondary | ICD-10-CM | POA: Diagnosis not present

## 2022-11-27 DIAGNOSIS — Z8669 Personal history of other diseases of the nervous system and sense organs: Secondary | ICD-10-CM | POA: Diagnosis not present

## 2022-11-27 DIAGNOSIS — M26623 Arthralgia of bilateral temporomandibular joint: Secondary | ICD-10-CM | POA: Diagnosis not present

## 2022-11-27 DIAGNOSIS — H9203 Otalgia, bilateral: Secondary | ICD-10-CM | POA: Diagnosis not present

## 2022-12-24 ENCOUNTER — Ambulatory Visit (INDEPENDENT_AMBULATORY_CARE_PROVIDER_SITE_OTHER): Payer: 59 | Admitting: Family

## 2022-12-24 ENCOUNTER — Encounter (INDEPENDENT_AMBULATORY_CARE_PROVIDER_SITE_OTHER): Payer: Self-pay | Admitting: Family

## 2022-12-24 VITALS — BP 108/90 | HR 100 | Ht <= 58 in | Wt 116.0 lb

## 2022-12-24 DIAGNOSIS — F70 Mild intellectual disabilities: Secondary | ICD-10-CM | POA: Diagnosis not present

## 2022-12-24 DIAGNOSIS — R400 Somnolence: Secondary | ICD-10-CM | POA: Diagnosis not present

## 2022-12-24 DIAGNOSIS — Z72820 Sleep deprivation: Secondary | ICD-10-CM | POA: Diagnosis not present

## 2022-12-24 DIAGNOSIS — H902 Conductive hearing loss, unspecified: Secondary | ICD-10-CM

## 2022-12-24 DIAGNOSIS — G44219 Episodic tension-type headache, not intractable: Secondary | ICD-10-CM

## 2022-12-24 DIAGNOSIS — R471 Dysarthria and anarthria: Secondary | ICD-10-CM

## 2022-12-24 NOTE — Progress Notes (Unsigned)
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 She has been experiencing headaches and dizziness since about February or March, 2023   Today's concerns: She reports today that headaches have improved since her last visit. She has tapered off Sertraline and Propranolol as planned and had no increase in headache frequency. Mackenzie Knight and her mother report ongoing problems with daytime fatigue. She typically naps for about 2 hours each day. Mom also reports that Mackenzie Knight is restless in sleep and awakens early.  Mom reports that Mackenzie Knight's OCD tendencies are worse. She obsesses about things such as the color of her Stanley cup and keeping at her side all the time. Mom notes that Mackenzie Knight's 59 year old grandfather has moved into the home and that the two often clash. Mackenzie Knight is interested in trying a medication for OCD but is concerned about possible side effect of weight gain. Mackenzie Knight enjoys adaptive surfing and engaged in that activity often in the summer. She developed ear pain while surfing and has been followed closely by ENT. Mackenzie Knight has been otherwise generally healthy since she was last seen. No health concerns today other than previously mentioned.  Review of systems: Please see HPI for neurologic and other pertinent review of systems. Otherwise all other systems were reviewed and were negative.  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/10/2014   Episodic tension type headache 08/10/2014   Fatigue 08/09/2014   Dizziness 07/20/2013   Deafness, sensorineural 09/01/2012   Obsessive-compulsive disorder 05/25/2012   Attention deficit disorder 05/25/2012   Lack of coordination 05/25/2012   Dysarthria 05/25/2012   Apraxia 05/25/2012   Conductive hearing loss, middle ear 12/23/2011     Past Medical History:  Diagnosis Date   ADHD (attention deficit hyperactivity disorder)    Anxiety    Developmental delay    Dizziness    since Feb/March 2023   Dysrhythmia    Febrile seizures (HCC)    as a child, none since 55yrs old, no current problem   GERD (gastroesophageal reflux disease)    no meds, diet   Headache    Hearing loss    sensorineural hearing loss   Hx of migraine headaches    since Feb/March 2023, HA treated with propanolol   Intellectual disability    mild   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 Dr. Rema Fendt at [facility] wanted to schedual eardrum reconstuction surgery, she has had more hearing loss recently in the right ear.    Her highest developmental quotient as a toddler was  71. When last tested before age 27 it was 42. In March 1996 the patient had episodes of unresponsive staring. EEG at 18 months was normal in the waking state. She was evaluated by Dr. Ramonita Lab, child neurologist at [facility]. 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 No further workup was recommended. She has  not had any imaging studies of her brain nor chromosomal studies.    She was given growth hormone at [facility] in an attempt to help her growth. Once off growth hormone she gained 8 pounds in 3 months. She had an irregular menstrual cycle.    She has had extensive physical occupational and speech therapy since she was a toddler. She has a prominent nose and forehead, slightly receding chin line, and short stature. As a young child she had severe dysarthria to the point of being unintelligible.    She had problems with mood, anxiety, difficulty sleeping, constipation, environmental allergies, and acne.   Behavior History labile mood, and anxiety, obsessive-compulsive behavior  Surgical history: Past Surgical History:  Procedure Laterality Date   BREAST LUMPECTOMY Right 02/07/2021   Ear Surgeries Right 03/13/2012   reconstruction of ear drum    ingrown toenails Left    RADIOLOGY WITH ANESTHESIA N/A 08/08/2021   Procedure: MRI WITH BRAIN WITHOUT CONTRAST;  Surgeon: Radiologist, Medication, MD;  Location: [facility] OR;  Service: Radiology;  Laterality: N/A;   TYMPANOPLASTY WO MASTOIDEC; WO OSSICULAR CHAIN  04/23/2012     Family history: family history includes Breast cancer in her maternal grandmother; Lupus in her mother; Other in her mother; Scleroderma in her mother.   Social history: Social History   Socioeconomic History   Marital status: Single    Spouse name: Not on file   Number of children: Not on file   Years of education: Not on file   Highest education level: Not on file  Occupational History   Not on file  Tobacco Use   Smoking status: Never    Passive exposure: Never   Smokeless tobacco: Never  Vaping Use   Vaping status: Never Used  Substance and Sexual Activity   Alcohol use: No   Drug use: No   Sexual activity: Never    Birth control/protection: Pill  Other Topics Concern   Not on file  Social History Narrative   Mackenzie Knight is a 32 yo female.   She is a  Engineer, agricultural.   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   Social Determinants of Health   Financial Resource Strain: Not on file  Food Insecurity: Low Risk  (08/11/2022)   Received from [facility]   Hunger Vital Sign    Worried About Running Out of Food in the Last Year: Never true    Ran Out of Food in the Last Year: Never true  Transportation Needs: Not on file (08/11/2022)  Physical Activity: Not on file  Stress: Not on file  Social Connections: Not on file  Intimate Partner Violence: Not on file    Past/failed meds: Copied from previous record: Sertraline not helpful for mood Propranolol not helpful for headache Emgality not helpful for headache  Allergies: Allergies  Allergen Reactions   Sulfa Antibiotics Hives   Pseudoephedrine Other (See Comments)    Sudafed causes aggitation   Immunizations:  There is no immunization history on file for this patient.   Diagnostics/Screenings: Copied from previous record: 08/08/2021 - MRI brain wo contrast -  Unremarkable non-contrast MRI appearance of the brain. No evidence of acute intracranial abnormality. Paranasal sinus disease, as described.   Physical Exam: BP (!) 108/90   Pulse 100   Ht 4' 8.69" (1.44 m)   Wt 116 lb (52.6 kg)   LMP 11/26/2022 (Approximate)   BMI 25.37 kg/m   General: Well developed, well nourished, seated on exam table, in no evident distress Head: Head normocephalic and atraumatic.  Oropharynx benign. Neck: Supple Cardiovascular: Regular rate and rhythm, no murmurs Respiratory: Breath sounds clear to auscultation Musculoskeletal: No obvious deformities or scoliosis Skin: No rashes or neurocutaneous lesions  Neurologic Exam Mental Status: Awake and fully alert.  Oriented to place and time. Fund of knowledge subnormal for age.  Mood and affect appropriate.Speech with dysarthria. Cranial Nerves: Fundoscopic exam reveals sharp disc margins.  Pupils equal, briskly reactive to light.  Extraocular movements  full without nystagmus. Hearing intact and symmetric to whisper.  Facial sensation intact.  Face tongue, palate move normally and symmetrically. Shoulder shrug normal Motor: Normal bulk and tone. Normal strength in all tested extremity muscles. Sensory: Intact to touch and temperature in all extremities.  Coordination: No dysmetria when reaching for objects. Gait and Station: Arises from chair without difficulty.  Stance is normal. Gait demonstrates normal stride length and balance.   Able to heel, toe and tandem walk without difficulty.  Impression: Daytime sleepiness - Plan: Ambulatory Referral for DME  Mild intellectual disability  Dysarthria  Conductive hearing loss, middle ear  Episodic tension-type headache, not intractable  Poor sleep - Plan: Ambulatory Referral for DME   Recommendations for plan of care: The patient's previous Epic records were reviewed. No recent diagnostic studies to be reviewed with the patient. I talked with Mackenzie Knight and her mother about their concerns.I recommended a sleep study but mother was reluctant because she did not feel that Mackenzie Knight would be able to cooperate. I recommended overnight pulse oximetry study to evaluate for hypoxemia during sleep that may result in daytime fatigue and Mom agreed with that plan.   We talked about sleep hygiene and made recommendations for shortening daytime naps and staying on a sleep schedule.   I also talked with them about getting established with a therapist for mood and OCD tendencies, and gave them information on how to accomplish that.  Plan until next visit: Call for questions or concerns Return in about 6 months (around 06/24/2023).  The medication list was reviewed and reconciled. No changes were made in the prescribed medications today. A complete medication list was provided to the patient.  Orders Placed This Encounter  Procedures   Ambulatory Referral for DME    Referral Priority:   Routine    Referral  Type:   Durable Medical Equipment Purchase    Number of Visits Requested:   1   Allergies as of 12/24/2022       Reactions   Sulfa Antibiotics Hives   Pseudoephedrine Other (See Comments)   Sudafed causes aggitation        Medication List        Accurate as of December 24, 2022 11:59 PM. If you have any questions, ask your nurse or doctor.          STOP taking these medications    Emgality 120 MG/ML Soaj Generic drug: Galcanezumab-gnlm Stopped by: Elveria Rising   propranolol 10 MG tablet Commonly known as: INDERAL Stopped by: Elveria Rising   sertraline 25 MG tablet Commonly known as: ZOLOFT Stopped by: Elveria Rising  TAKE these medications    ACIDOPHILUS HIGH-POTENCY PO Take 2 capsules by mouth daily.   BIOTIN PO Take 6,000 mcg by mouth daily.   CALCIUM 600+D PO Take 1 tablet by mouth daily.   calcium carbonate (dosed in mg elemental calcium) 1250 MG/5ML Susp Take by mouth.   cetirizine 10 MG tablet Commonly known as: ZYRTEC Take 10 mg by mouth daily as needed for allergies.   clindamycin-benzoyl peroxide gel Commonly known as: BENZACLIN Apply 1 application  topically daily as needed (acne).   drospirenone-ethinyl estradiol 3-0.02 MG tablet Commonly known as: YAZ Take 1 tablet by mouth daily.   fluticasone 50 MCG/ACT nasal spray Commonly known as: FLONASE Place 1 spray into both nostrils daily as needed for allergies or rhinitis.   ibuprofen 200 MG tablet Commonly known as: ADVIL Take 400 mg by mouth every 6 (six) hours as needed for headache.   meloxicam 15 MG tablet Commonly known as: MOBIC Take 1 tablet every day by oral route as needed for 30 days, for pain.   naproxen 500 MG tablet Commonly known as: NAPROSYN Take 500 mg by mouth 2 (two) times daily as needed for moderate pain.   spironolactone 50 MG tablet Commonly known as: ALDACTONE Take 50 mg by mouth daily.   vitamin C 1000 MG tablet Take 1,000 mg by mouth  daily.   Vitamin D (Cholecalciferol) 10 MCG (400 UNIT) Caps Take 400 Units by mouth daily.   ZINC PO Take 1 tablet by mouth daily.      Total time spent with the patient was 30 minutes, of which 50% or more was spent in counseling and coordination of care.  Elveria Rising NP-C [facility] [address] [place], [place] [address] Ph. [phone] Fax [phone]

## 2022-12-24 NOTE — Patient Instructions (Addendum)
It was a pleasure to see you today!  Instructions for you until your next appointment are as follows: Look at this website - [email or web address]. Put in filters that you would want for a therapist and see if you see someone that you would want to work with Another option would be [facility] for a therapist. Their phone number is [phone] I will order a study called an Overnight Pulse Oximeter. This will measure your heart rate and levels of oxygen in your blood while you sleep. Low oxygen levels during sleep can be an indicator of an obstructive sleep disorder. I will call you when I receive the report. You will receive a call from an agency called [facility] to schedule this test.  Work on shortening your naps in the afternoon. It is ok to rest, but try to limit the naps to 1 hour or less Please sign up for MyChart if you have not done so. Please plan to return for follow up in 6 months or sooner if needed.  Feel free to contact our office during normal business hours at [phone] with questions or concerns. If there is no answer or the call is outside business hours, please leave a message and our clinic staff will call you back within the next business day.  If you have an urgent concern, please stay on the line for our after-hours answering service and ask for the on-call neurologist.     I also encourage you to use MyChart to communicate with me more directly. If you have not yet signed up for MyChart within [facility], the front desk staff can help you. However, please note that this inbox is NOT monitored on nights or weekends, and response can take up to 2 business days.  Urgent matters should be discussed with the on-call pediatric neurologist.   At [facility], we are committed to providing exceptional care. You will receive a patient satisfaction survey through text or email regarding your visit today. Your opinion is important to me.  Comments are appreciated.

## 2023-01-06 DIAGNOSIS — H9202 Otalgia, left ear: Secondary | ICD-10-CM | POA: Diagnosis not present

## 2023-02-03 DIAGNOSIS — R400 Somnolence: Secondary | ICD-10-CM | POA: Diagnosis not present

## 2023-04-02 DIAGNOSIS — Z0000 Encounter for general adult medical examination without abnormal findings: Secondary | ICD-10-CM | POA: Diagnosis not present

## 2023-04-02 DIAGNOSIS — L700 Acne vulgaris: Secondary | ICD-10-CM | POA: Diagnosis not present

## 2023-05-25 DIAGNOSIS — H6692 Otitis media, unspecified, left ear: Secondary | ICD-10-CM | POA: Diagnosis not present

## 2023-06-28 NOTE — Progress Notes (Unsigned)
 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 She has been experiencing headaches and dizziness since about February or March, 2023   Today's concerns: She reports today that headaches have continued to improve. She notes that stressors of dealing with her 76 year old grandfather who lives in the home as a trigger for headaches. Mackenzie Knight enjoys adaptive surfing and is looking forward to that activity this summer She has a red rash under her breasts. Mom believes that it is from wearing a sports bra and perspiring Mackenzie Knight has been otherwise generally healthy since she was last seen. No health concerns today other than previously mentioned.  Review of systems: Please see HPI for neurologic and other pertinent review of systems. Otherwise all other systems were reviewed and were negative.  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/10/2014   Episodic tension type headache 08/10/2014   Fatigue 08/09/2014   Dizziness 07/20/2013   Deafness, sensorineural 09/01/2012   Obsessive-compulsive disorder 05/25/2012   Attention deficit disorder 05/25/2012   Lack of coordination 05/25/2012   Dysarthria 05/25/2012   Apraxia 05/25/2012    Conductive hearing loss, middle ear 12/23/2011     Past Medical History:  Diagnosis Date   ADHD (attention deficit hyperactivity disorder)    Anxiety    Developmental delay    Dizziness    since Feb/March 2023   Dysrhythmia    Febrile seizures (HCC)    as a child, none since 72yrs old, no current problem   GERD (gastroesophageal reflux disease)    no meds, diet   Headache    Hearing loss    sensorineural hearing loss   Hx of migraine headaches    since Feb/March 2023, HA treated with propanolol   Intellectual disability    mild   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 Dr. Glenn Lange at [facility] wanted to schedual eardrum reconstuction surgery, she has had more hearing loss recently in the right ear.    Her highest developmental quotient as a toddler was 62. When last tested before age 45 it was 27. In March 1996 the patient had episodes of unresponsive staring. EEG at 18 months was normal in the waking state. She was evaluated by Dr. Darletta Ehrich, child neurologist at [facility]. 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 No further workup was recommended. She has not had any imaging studies of her brain nor chromosomal  studies.    She was given growth hormone at [facility] in an attempt to help her growth. Once off growth hormone she gained 8 pounds in 3 months. She had an irregular menstrual cycle.    She has had extensive physical occupational and speech therapy since she was a toddler. She has a prominent nose and forehead, slightly receding chin line, and short stature. As a young child she had severe  dysarthria to the point of being unintelligible.    She had problems with mood, anxiety, difficulty sleeping, constipation, environmental allergies, and acne.   Behavior History labile mood, and anxiety, obsessive-compulsive behavior  Surgical history: Past Surgical History:  Procedure Laterality Date   BREAST LUMPECTOMY Right 02/07/2021   Ear Surgeries Right 03/13/2012   reconstruction of ear drum    ingrown toenails Left    RADIOLOGY WITH ANESTHESIA N/A 08/08/2021   Procedure: MRI WITH BRAIN WITHOUT CONTRAST;  Surgeon: Radiologist, Medication, MD;  Location: [facility] OR;  Service: Radiology;  Laterality: N/A;   TYMPANOPLASTY WO MASTOIDEC; WO OSSICULAR CHAIN  04/23/2012     Family history: family history includes Breast cancer in her maternal grandmother; Lupus in her mother; Other in her mother; Scleroderma in her mother.   Social history: Social History   Socioeconomic History   Marital status: Single    Spouse name: Not on file   Number of children: Not on file   Years of education: Not on file   Highest education level: Not on file  Occupational History   Not on file  Tobacco Use   Smoking status: Never    Passive exposure: Never   Smokeless tobacco: Never  Vaping Use   Vaping status: Never Used  Substance and Sexual Activity   Alcohol use: No   Drug use: No   Sexual activity: Never    Birth control/protection: Pill  Other Topics Concern   Not on file  Social History Narrative   Israel is a 33 yo female.   She is a Engineer, agricultural.   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   Social Drivers of Corporate investment banker Strain: Not on file  Food Insecurity: Low Risk  (08/11/2022)   Received from [facility]   Hunger Vital Sign    Within the past 12 months, you worried that your food would run out before you got money to buy more: Never true    Within the past 12 months, the food you bought just didn't last and you didn't have money to get more. : Never true   Transportation Needs: Not on file (08/11/2022)  Physical Activity: Not on file  Stress: Not on file  Social Connections: Not on file  Intimate Partner Violence: Not on file    Past/failed meds: Copied from previous record: Sertraline  not helpful for mood Propranolol  not helpful for headache Emgality  not helpful for headache  Allergies: Allergies  Allergen Reactions   Sulfa Antibiotics Hives   Pseudoephedrine Other (See Comments)    Sudafed causes aggitation    Immunizations:  There is no immunization history on file for this patient.    Diagnostics/Screenings: Copied from previous record: Overnight pulse oximetry  08/08/2021 - MRI brain wo contrast - Unremarkable non-contrast MRI appearance of the brain. No evidence of acute intracranial abnormality. Paranasal sinus disease, as described.   Physical Exam: BP 116/78   Pulse 100   Ht 4' 8.69 (1.44 m)   Wt 117 lb 9.6 oz (53.3 kg)   SpO2 99%  BMI 25.72 kg/m   General: Well developed, well nourished young woman, seated on exam table, in no evident distress Head: Head normocephalic and atraumatic.  Oropharynx benign. Neck: Supple Cardiovascular: Regular rate and rhythm, no murmurs Respiratory: Breath sounds clear to auscultation Musculoskeletal: No obvious deformities or scoliosis Skin: No neurocutaneous lesions. Has red raised rash under her breasts  Neurologic Exam Mental Status: Awake and fully alert. Oriented to place and time. Fund of knowledge subnormal for age. Speech with dysarthria. Cranial Nerves: Fundoscopic exam reveals sharp disc margins.  Pupils equal, briskly reactive to light.  Extraocular movements full without nystagmus. Hearing intact and symmetric to whisper.  Facial sensation intact.  Face tongue, palate move normally and symmetrically. Shoulder shrug normal Motor: Normal bulk and tone. Normal strength in all tested extremity muscles. Sensory: Intact to touch and temperature in all extremities.   Coordination: No dysmetria when reaching for objects Gait and Station: Arises from chair without difficulty.  Stance is normal. Gait demonstrates normal stride length and balance.   Able to heel, toe and tandem walk without difficulty.  Impression: Episodic tension-type headache, not intractable  Localized rash - Plan: nystatin cream (MYCOSTATIN)  Mild intellectual disability  Dysarthria  Conductive hearing loss, middle ear  Migraine without aura and without status migrainosus, not intractable   Recommendations for plan of care: The patient's previous Epic records were reviewed. No recent diagnostic studies to be reviewed with the patient.  Plan until next visit: Start Nystatin for rash under breasts. Cover with thin layer of Desitin to serve as a barrier.  Continue medications as prescribed  Call for questions or concerns Return in about 6 months (around 12/29/2023).  The medication list was reviewed and reconciled. No changes were made in the prescribed medications today. A complete medication list was provided to the patient.  Allergies as of 06/29/2023       Reactions   Sulfa Antibiotics Hives   Pseudoephedrine Other (See Comments)   Sudafed causes aggitation        Medication List        Accurate as of June 29, 2023  7:23 PM. If you have any questions, ask your nurse or doctor.          STOP taking these medications    clindamycin-benzoyl peroxide gel Commonly known as: BENZACLIN Stopped by: Lyndol Santee   meloxicam 15 MG tablet Commonly known as: MOBIC Stopped by: Lyndol Santee   naproxen 500 MG tablet Commonly known as: NAPROSYN Stopped by: Lyndol Santee   ZINC PO Stopped by: Lyndol Santee       TAKE these medications    ACIDOPHILUS HIGH-POTENCY PO Take 2 capsules by mouth daily.   BIOTIN PO Take 6,000 mcg by mouth daily.   CALCIUM 600+D PO Take 1 tablet by mouth daily.   calcium carbonate (dosed in mg elemental  calcium) 1250 MG/5ML Susp Take by mouth.   cetirizine 10 MG tablet Commonly known as: ZYRTEC Take 10 mg by mouth daily as needed for allergies.   drospirenone-ethinyl estradiol 3-0.02 MG tablet Commonly known as: YAZ Take 1 tablet by mouth daily.   fluticasone 50 MCG/ACT nasal spray Commonly known as: FLONASE Place 1 spray into both nostrils daily as needed for allergies or rhinitis.   ibuprofen  200 MG tablet Commonly known as: ADVIL  Take 400 mg by mouth every 6 (six) hours as needed for headache.   nystatin cream Commonly known as: MYCOSTATIN Apply thin layer to rash in the morning and at night Started  by: Lyndol Santee   spironolactone 50 MG tablet Commonly known as: ALDACTONE Take 50 mg by mouth daily.   Tylenol  325 MG tablet Generic drug: acetaminophen  1 tablet as needed Orally every 6 hrs   vitamin C 1000 MG tablet Take 1,000 mg by mouth daily.   Vitamin D (Cholecalciferol) 10 MCG (400 UNIT) Caps Take 400 Units by mouth daily.      Total time spent with the patient was 25 minutes, of which 50% or more was spent in counseling and coordination of care.  Lyndol Santee NP-C [facility] [address] [place], [place] [address] Ph. [phone] Fax [phone]

## 2023-06-29 ENCOUNTER — Ambulatory Visit (INDEPENDENT_AMBULATORY_CARE_PROVIDER_SITE_OTHER): Payer: 59 | Admitting: Family

## 2023-06-29 ENCOUNTER — Encounter (INDEPENDENT_AMBULATORY_CARE_PROVIDER_SITE_OTHER): Payer: Self-pay | Admitting: Family

## 2023-06-29 VITALS — BP 116/78 | HR 100 | Ht <= 58 in | Wt 117.6 lb

## 2023-06-29 DIAGNOSIS — R21 Rash and other nonspecific skin eruption: Secondary | ICD-10-CM

## 2023-06-29 DIAGNOSIS — R471 Dysarthria and anarthria: Secondary | ICD-10-CM | POA: Diagnosis not present

## 2023-06-29 DIAGNOSIS — F70 Mild intellectual disabilities: Secondary | ICD-10-CM | POA: Diagnosis not present

## 2023-06-29 DIAGNOSIS — G44219 Episodic tension-type headache, not intractable: Secondary | ICD-10-CM | POA: Diagnosis not present

## 2023-06-29 DIAGNOSIS — H902 Conductive hearing loss, unspecified: Secondary | ICD-10-CM | POA: Diagnosis not present

## 2023-06-29 DIAGNOSIS — G43009 Migraine without aura, not intractable, without status migrainosus: Secondary | ICD-10-CM | POA: Diagnosis not present

## 2023-06-29 MED ORDER — NYSTATIN 100000 UNIT/GM EX CREA: TOPICAL_CREAM | CUTANEOUS | 1 refills | Status: DC

## 2023-06-29 NOTE — Patient Instructions (Signed)
 It was a pleasure to see you today!  Instructions for you until your next appointment are as follows: Start Nystatin cream to rash. Apply a thin layer to the rash twice per day. Apply a thin layer of Desitin on top of the Nystatin to serve as a barrier Let me know if the headaches become more frequent or more severe Please sign up for MyChart if you have not done so. Please plan to return for follow up in 6 or sooner if needed.  Feel free to contact our office during normal business hours at [phone] with questions or concerns. If there is no answer or the call is outside business hours, please leave a message and our clinic staff will call you back within the next business day.  If you have an urgent concern, please stay on the line for our after-hours answering service and ask for the on-call neurologist.     I also encourage you to use MyChart to communicate with me more directly. If you have not yet signed up for MyChart within [facility], the front desk staff can help you. However, please note that this inbox is NOT monitored on nights or weekends, and response can take up to 2 business days.  Urgent matters should be discussed with the on-call pediatric neurologist.   At [facility], we are committed to providing exceptional care. You will receive a patient satisfaction survey through text or email regarding your visit today. Your opinion is important to me. Comments are appreciated.

## 2023-07-06 DIAGNOSIS — L304 Erythema intertrigo: Secondary | ICD-10-CM | POA: Diagnosis not present

## 2023-07-06 DIAGNOSIS — H6980 Other specified disorders of Eustachian tube, unspecified ear: Secondary | ICD-10-CM | POA: Diagnosis not present

## 2023-07-14 ENCOUNTER — Encounter: Payer: Self-pay | Admitting: Family

## 2023-07-27 DIAGNOSIS — B372 Candidiasis of skin and nail: Secondary | ICD-10-CM | POA: Diagnosis not present

## 2023-07-27 DIAGNOSIS — N62 Hypertrophy of breast: Secondary | ICD-10-CM | POA: Diagnosis not present

## 2023-07-28 DIAGNOSIS — L309 Dermatitis, unspecified: Secondary | ICD-10-CM | POA: Diagnosis not present

## 2023-07-28 DIAGNOSIS — D224 Melanocytic nevi of scalp and neck: Secondary | ICD-10-CM | POA: Diagnosis not present

## 2023-07-28 DIAGNOSIS — D2272 Melanocytic nevi of left lower limb, including hip: Secondary | ICD-10-CM | POA: Diagnosis not present

## 2023-07-28 DIAGNOSIS — L700 Acne vulgaris: Secondary | ICD-10-CM | POA: Diagnosis not present

## 2023-07-28 DIAGNOSIS — L304 Erythema intertrigo: Secondary | ICD-10-CM | POA: Diagnosis not present

## 2023-08-07 DIAGNOSIS — M545 Low back pain, unspecified: Secondary | ICD-10-CM | POA: Diagnosis not present

## 2023-08-13 DIAGNOSIS — M549 Dorsalgia, unspecified: Secondary | ICD-10-CM | POA: Diagnosis not present

## 2023-08-13 DIAGNOSIS — L304 Erythema intertrigo: Secondary | ICD-10-CM | POA: Diagnosis not present

## 2023-08-13 DIAGNOSIS — G8929 Other chronic pain: Secondary | ICD-10-CM | POA: Diagnosis not present

## 2023-08-13 DIAGNOSIS — G4486 Cervicogenic headache: Secondary | ICD-10-CM | POA: Diagnosis not present

## 2023-08-13 DIAGNOSIS — N62 Hypertrophy of breast: Secondary | ICD-10-CM | POA: Diagnosis not present

## 2023-08-13 DIAGNOSIS — M542 Cervicalgia: Secondary | ICD-10-CM | POA: Diagnosis not present

## 2023-10-27 ENCOUNTER — Ambulatory Visit: Admitting: Plastic Surgery

## 2023-10-27 VITALS — BP 113/81 | HR 101 | Ht <= 58 in | Wt 118.0 lb

## 2023-10-27 DIAGNOSIS — M542 Cervicalgia: Secondary | ICD-10-CM | POA: Diagnosis not present

## 2023-10-27 DIAGNOSIS — Z803 Family history of malignant neoplasm of breast: Secondary | ICD-10-CM | POA: Diagnosis not present

## 2023-10-27 DIAGNOSIS — Z6825 Body mass index (BMI) 25.0-25.9, adult: Secondary | ICD-10-CM | POA: Diagnosis not present

## 2023-10-27 DIAGNOSIS — M546 Pain in thoracic spine: Secondary | ICD-10-CM | POA: Diagnosis not present

## 2023-10-27 DIAGNOSIS — N62 Hypertrophy of breast: Secondary | ICD-10-CM | POA: Diagnosis not present

## 2023-10-27 DIAGNOSIS — M549 Dorsalgia, unspecified: Secondary | ICD-10-CM | POA: Insufficient documentation

## 2023-10-27 DIAGNOSIS — G8929 Other chronic pain: Secondary | ICD-10-CM

## 2023-10-27 DIAGNOSIS — Z9889 Other specified postprocedural states: Secondary | ICD-10-CM

## 2023-10-27 NOTE — Progress Notes (Signed)
 Patient ID: Mackenzie Knight, female    DOB: 1990/05/03, 33 y.o.   MRN: [id number]   Chief Complaint  Patient presents with   Skin Problem    Mammary Hyperplasia: The patient is a 33 y.o. female with a history of mammary hyperplasia for several years.  She has extremely large breasts causing symptoms that include the following: Back pain in the upper and lower back, including neck pain. She pulls or pins her bra straps to provide better lift and relief of the pressure and pain. She notices relief by holding her breast up manually.  Her shoulder straps cause grooves and pain and pressure that requires padding for relief. Pain medication is sometimes required with motrin  and tylenol .  Activities that are hindered by enlarged breasts include: exercise and running.  She has tried supportive clothing as well as fitted bras without improvement.  Her breasts are extremely large and the right is larger.  The patient had benign tumor in the right breast that was excised about 2 years ago.  She has hyperpigmentation of the inframammary area on both sides.  The sternal to nipple distance on the right is 29 cm and the left is 26 cm.  The IMF distance is 10 cm.  She is 4 feet 9 inches tall and weighs 118 pounds.  The BMI = 25.5 kg/m.  Preoperative bra size = DD cup.  She is hoping for around a C cup.  The estimated excess breast tissue to be removed at the time of surgery = 200-250 grams on the left and 200-250 grams on the right.  Mammogram history: None.  Family history of breast cancer: Maternal grandmother.  Tobacco use: None.   The patient expresses the desire to pursue surgical intervention.     Review of Systems  Constitutional: Negative.   Eyes: Negative.   Respiratory: Negative.    Cardiovascular: Negative.   Gastrointestinal: Negative.   Endocrine: Negative.   Genitourinary: Negative.   Musculoskeletal:  Positive for back pain and neck pain.  Allergic/Immunologic: Negative.    Neurological: Negative.     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   Seasonal allergies     Past Surgical History:  Procedure Laterality Date   BREAST LUMPECTOMY Right 02/07/2021   Ear Surgeries Right 03/13/2012   reconstruction of ear drum    ingrown toenails Left    RADIOLOGY WITH ANESTHESIA N/A 08/08/2021   Procedure: MRI WITH BRAIN WITHOUT CONTRAST;  Surgeon: Radiologist, Medication, MD;  Location: [facility] OR;  Service: Radiology;  Laterality: N/A;   TYMPANOPLASTY WO MASTOIDEC; WO OSSICULAR CHAIN  04/23/2012      Current Outpatient Medications:    acetaminophen  (TYLENOL ) 325 MG tablet, 1 tablet as needed Orally every 6 hrs, Disp: , Rfl:    Ascorbic Acid (VITAMIN C) 1000 MG tablet, Take 1,000  mg by mouth daily., Disp: , Rfl:    BIOTIN PO, Take 6,000 mcg by mouth daily., Disp: , Rfl:    Calcium Carbonate Antacid (CALCIUM CARBONATE, DOSED IN MG ELEMENTAL CALCIUM,) 1250 MG/5ML SUSP, Take by mouth., Disp: , Rfl:    Calcium Carbonate-Vitamin D (CALCIUM 600+D PO), Take 1 tablet by mouth daily., Disp: , Rfl:    cetirizine (ZYRTEC) 10 MG tablet, Take 10 mg by mouth daily as needed for allergies., Disp: , Rfl:    drospirenone-ethinyl estradiol (YAZ,GIANVI,LORYNA) 3-0.02 MG tablet, Take 1 tablet by mouth daily., Disp: , Rfl:    fluticasone (FLONASE) 50 MCG/ACT nasal spray, Place 1 spray into both nostrils daily as needed for allergies or rhinitis., Disp: , Rfl:    ibuprofen   (ADVIL ) 200 MG tablet, Take 400 mg by mouth every 6 (six) hours as needed for headache., Disp: , Rfl:    nystatin  cream (MYCOSTATIN ), Apply thin layer to rash in the morning and at night, Disp: 30 g, Rfl: 1   Probiotic Product (ACIDOPHILUS HIGH-POTENCY PO), Take 2 capsules by mouth daily., Disp: , Rfl:    spironolactone (ALDACTONE) 50 MG tablet, Take 50 mg by mouth daily., Disp: , Rfl:    Vitamin D, Cholecalciferol, 10 MCG (400 UNIT) CAPS, Take 400 Units by mouth daily., Disp: , Rfl:    Objective:   Vitals:   10/27/23 1425  BP: 113/81  Pulse: (!) 101  SpO2: 100%    Physical Exam Vitals reviewed.  Constitutional:      Appearance: Normal appearance.  HENT:     Head: Atraumatic.  Cardiovascular:     Rate and Rhythm: Normal rate.     Pulses: Normal pulses.  Pulmonary:     Effort: Pulmonary effort is normal.  Abdominal:     General: There is no distension.     Palpations: Abdomen is soft.  Skin:    General: Skin is warm.     Capillary Refill: Capillary refill takes less than 2 seconds.     Coloration: Skin is not jaundiced.     Findings: No bruising.  Neurological:     Mental Status: She is alert and oriented to person, place, and time.  Psychiatric:        Mood and Affect: Mood normal.        Behavior: Behavior normal.        Thought Content: Thought content normal.        Judgment: Judgment normal.     Assessment & Plan:  Macromastia - Plan: US  LIMITED ULTRASOUND INCLUDING AXILLA RIGHT BREAST, CANCELED: MM Digital Screening  History of lumpectomy of right breast - Plan: US  LIMITED ULTRASOUND INCLUDING AXILLA RIGHT BREAST, CANCELED: MM Digital Screening  Symptomatic mammary hypertrophy  Chronic bilateral thoracic back pain  The procedure the patient selected and that was best for the patient was discussed. The risk were discussed and include but not limited to the following:  Breast asymmetry, fluid accumulation, firmness of the breast, inability to breast feed, loss  of nipple or areola, skin loss, change in skin and nipple sensation, fat necrosis of the breast tissue, bleeding, infection and healing delay.  There are risks of anesthesia and injury to nerves or blood vessels.  Allergic reaction to tape, suture and skin glue are possible.  There will be swelling.  Any of these can lead to the need for revisional surgery which is not included in this surgery.  A breast reduction has potential to interfere with diagnostic procedures in the future.  This procedure is best done when the breast is fully developed.  Changes in the breast will continue to occur over time: pregnancy, weight gain or weigh loss. No guarantees are given for a certain bra or breast size.    Total time: 40 minutes. This includes time spent with the patient during the visit as well as time spent before and after the visit reviewing the chart, documenting the encounter, ordering pertinent studies and literature for the patient.   Physical therapy:  not required Mammogram: Due to her history we will order an ultrasound of the right breast Patient is a good candidate for bilateral breast reduction with possible liposuction.  Do want to get the ultrasound the right breast to make sure there is not any other tumor in the area.  Pictures were obtained of the patient and placed in the chart with the patient's or guardian's permission.   Estefana RAMAN Orvetta Danielski, DO

## 2023-11-02 DIAGNOSIS — J069 Acute upper respiratory infection, unspecified: Secondary | ICD-10-CM | POA: Diagnosis not present

## 2023-11-03 ENCOUNTER — Ambulatory Visit
Admission: RE | Admit: 2023-11-03 | Discharge: 2023-11-03 | Disposition: A | Discharge status: Home / Self Care | Source: Ambulatory Visit | Attending: Plastic Surgery | Admitting: Plastic Surgery

## 2023-11-03 ENCOUNTER — Other Ambulatory Visit: Payer: Self-pay | Admitting: Plastic Surgery

## 2023-11-03 DIAGNOSIS — N6489 Other specified disorders of breast: Secondary | ICD-10-CM | POA: Diagnosis not present

## 2023-11-03 DIAGNOSIS — N62 Hypertrophy of breast: Secondary | ICD-10-CM

## 2023-11-03 DIAGNOSIS — Z9889 Other specified postprocedural states: Secondary | ICD-10-CM

## 2023-11-03 DIAGNOSIS — R928 Other abnormal and inconclusive findings on diagnostic imaging of breast: Secondary | ICD-10-CM | POA: Diagnosis not present

## 2023-11-09 DIAGNOSIS — J40 Bronchitis, not specified as acute or chronic: Secondary | ICD-10-CM | POA: Diagnosis not present

## 2023-12-25 ENCOUNTER — Ambulatory Visit: Admitting: Student

## 2023-12-25 VITALS — BP 124/86 | HR 106 | Wt 118.0 lb

## 2023-12-25 DIAGNOSIS — Z803 Family history of malignant neoplasm of breast: Secondary | ICD-10-CM | POA: Diagnosis not present

## 2023-12-25 DIAGNOSIS — N62 Hypertrophy of breast: Secondary | ICD-10-CM

## 2023-12-25 MED ORDER — ONDANSETRON HCL 4 MG PO TABS: 4.0000 mg | ORAL_TABLET | Freq: Three times a day (TID) | ORAL | 0 refills | Status: AC | PRN

## 2023-12-25 MED ORDER — CEPHALEXIN 500 MG PO CAPS: 500.0000 mg | ORAL_CAPSULE | Freq: Four times a day (QID) | ORAL | 0 refills | Status: AC

## 2023-12-25 MED ORDER — OXYCODONE HCL 5 MG PO TABS: 5.0000 mg | ORAL_TABLET | Freq: Four times a day (QID) | ORAL | 0 refills | Status: AC | PRN

## 2023-12-25 NOTE — Progress Notes (Signed)
 Patient ID: Mackenzie Knight, female    DOB: [birth year], 33 y.o.   MRN: [id number]  Chief Complaint  Patient presents with   Pre-op Exam      ICD-10-CM   1. Macromastia  N62        History of Present Illness: Mackenzie Knight is a 33 y.o.  female  with a history of macromastia.  She presents for preoperative evaluation for upcoming procedure, Bilateral Breast Reduction with liposuction, scheduled for 01/20/2024 with Dr.  Lowery  Patient presents with her mother at bedside.  The patient has had problems with anesthesia.  She reports that she has had some nausea after anesthesia.  Denies any other issues with anesthesia.  Patient does reports she had a lumpectomy about 3 years ago.  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  Patient does report that her grandmother had breast cancer.  Patient denies any history of cardiac disease.  She denies taking any blood thinners.  Patient reports she is not a smoker.  Patient states that she is currently taking birth control pills.  She denies any history of greater than 3 miscarriages.  She denies any personal family history of blood clots or clotting diseases.  She denies any recent surgeries, traumas or infections.  She denies any history of stroke or heart attack.  She denies any history of Crohn's disease or ulcerative colitis, COPD or asthma.  She denies any history of cancer.  She denies any varicosities to her lower extremities.  She reports that she had a cold about 2 weeks ago, but that this has been improving.  She denies any other recent changes in her health.  She denies any current fevers, chills.  Patient reports she is currently a DD cup.  She states that she would like to be around a B/C cup.  Discussed with the patient that cup size cannot be guaranteed.  She expressed understanding.  Summary of Previous Visit: Patient was seen for initial consult by Dr. Lowery on 10/27/2023.  At this visit,  patient complained of upper back and neck pain due to her enlarged breast.  On exam, her STN on the right was 29 cm and her STN on the left was 26 cm.  Her BMI was 25.5 kg/m.  Her preoperative bra size was a DDD cup.  Patient stated that she was hoping to be around a C cup.  The estimated amount of excess breast tissue to be removed at the time of surgery was 200-250 g bilaterally.  Patient was found to be a good candidate for bilateral breast reduction with possible lipo suction.  An ultrasound was ordered to the right breast to make sure that there was not any other tumor in the area.  Per chart review, patient had ultrasound/diagnostic mammogram completed on 11/03/2023 which did not show any evidence of malignancy in either breast.  Estimated excess breast tissue to be removed at time of surgery: 200-250 grams  Job: Does not work at this time  PMH Significant for: Migraine, ADD, macromastia   Past Medical History: Allergies: Allergies[1]  Current Medications: Current Medications[2]  Past Medical Problems: Past Medical History:  Diagnosis Date   ADHD (attention deficit hyperactivity disorder)    Anxiety    Developmental delay    Dizziness    since Feb/March 2023   Dysrhythmia    Febrile seizures (HCC)    as a child, none since 23yrs old, no current problem  GERD (gastroesophageal reflux disease)    no meds, diet   Headache    Hearing loss    sensorineural hearing loss   Hx of migraine headaches    since Feb/March 2023, HA treated with propanolol   Intellectual disability    mild   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   Seasonal allergies     Past Surgical History: Past Surgical History:  Procedure Laterality Date   BREAST EXCISIONAL BIOPSY Right 01/2021   fibroadenoma   Ear Surgeries Right 03/13/2012   reconstruction of ear drum    ingrown toenails Left    RADIOLOGY WITH ANESTHESIA N/A 08/08/2021   Procedure: MRI WITH BRAIN  WITHOUT CONTRAST;  Surgeon: Radiologist, Medication, MD;  Location: [facility] OR;  Service: Radiology;  Laterality: N/A;   TYMPANOPLASTY WO MASTOIDEC; WO OSSICULAR CHAIN  04/23/2012    Social History: Social History   Socioeconomic History   Marital status: Single    Spouse name: Not on file   Number of children: Not on file   Years of education: Not on file   Highest education level: Not on file  Occupational History   Not on file  Tobacco Use   Smoking status: Never    Passive exposure: Never   Smokeless tobacco: Never  Vaping Use   Vaping status: Never Used  Substance and Sexual Activity   Alcohol use: No   Drug use: No   Sexual activity: Never    Birth control/protection: Pill  Other Topics Concern   Not on file  Social History Narrative   Mackenzie Knight is a 33 yo female.   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 : Never true  Transportation Needs: No Transportation Needs (08/11/2022)   Received from [facility]    In the past 12 months, has lack of reliable transportation kept you from medical appointments, meetings, work or from getting things needed for daily living? : No  Physical Activity: Not on file  Stress: Not on file  Social Connections: Not on file  Intimate Partner Violence: Not on file  Depression (EYV7-0): Not on file  Alcohol Screen: Not on file  Housing: Low  Risk (08/11/2022)   Received from [facility]   Epic    What is your living situation today?: I have a steady place to live    Think about the place you live. Do you have problems with any of the following? Choose all that apply:: Not on file  Utilities: Low Risk (08/11/2022)   Received from [facility]   Utilities    In the past 12 months has the electric, gas, oil, or water company threatened to shut off services in your home? :  No  Health Literacy: Not on file    Family History: Family History  Problem Relation Age of Onset   Lupus Mother    Scleroderma Mother    Other Mother        GAVE Disease, Autoimmune Disease, Stomache Bleeds, Low Body Temp, Fatigue   Breast cancer Maternal Grandmother        Died at 56    Review of Systems: Reports cold 2 weeks ago, resolving.  Otherwise denies any recent fevers, chills or changes in her health  Physical Exam: Vital Signs BP 124/86 (BP Location: Right Arm, Patient Position: Sitting, Cuff Size: Normal)   Pulse (!) 106   Wt 118 lb (53.5 kg)   SpO2 100%   BMI 25.09 kg/m   Physical Exam  Constitutional:      General: Not in acute distress.    Appearance: Normal appearance. Not ill-appearing.  HENT:     Head: Normocephalic and atraumatic.  Eyes:     Pupils: Pupils are equal, round Neck:     Musculoskeletal: Normal range of motion.  Cardiovascular:     Rate and Rhythm: Normal rate Pulmonary:     Effort: Pulmonary effort is normal. No respiratory distress.  Musculoskeletal: Normal range of motion.  Skin:    General: Skin is warm and dry.     Findings: No erythema or rash.  Neurological:     Mental Status: Alert and oriented to person, place, and time. Mental status is at baseline.  Psychiatric:        Mood and Affect: Mood normal.        Behavior: Behavior normal.    Assessment/Plan: The patient is scheduled for bilateral breast reduction with Dr. Lowery.  Risks, benefits, and alternatives of procedure discussed,  questions answered and consent obtained.    Smoking Status: Non-smoker; Counseling Given?  N/A Last Mammogram: 11/03/2023; Results: BI-RADS Category 1 negative  Caprini Score: 4; Risk Factors include: Taking oral birth control, BMI > 25, and length of planned surgery. Recommendation for mechanical prophylaxis. Encourage early ambulation.   Pictures obtained: @consult   Post-op Rx sent to pharmacy: Oxycodone, Zofran , Keflex   Instructed the patient to hold any multivitamins, supplements or NSAIDs at least 1 week prior to surgery.  Discussed with her to hold her spironolactone on the day of surgery.  We discussed the risks of estrogen with developing blood clots.  Recommended that she hold her birth control at least 2 weeks before and 2 weeks after surgery.  Patient expressed understanding.  Patient was provided with the breast reduction and General Surgical Risk consent document and Pain Medication Agreement prior to their appointment.  They had adequate time to read through the risk consent documents and Pain Medication Agreement. We also discussed them in person together during this preop appointment. All of their questions were answered to their satisfaction.  Recommended calling if they have any further questions.  Risk consent form and Pain Medication Agreement to be scanned into patient's chart.  The risk that can be encountered with breast reduction were discussed and include the following but not limited to these:  Breast asymmetry, fluid accumulation, firmness of the breast, inability to breast feed, loss of nipple or areola, skin loss, decrease or no nipple sensation, fat necrosis of the breast tissue, bleeding, infection, healing delay.  There are risks of anesthesia, changes to skin sensation and injury to nerves or blood vessels.  The muscle can be temporarily or permanently injured.  You may have an allergic reaction  to tape, suture, glue, blood products which can result in skin  discoloration, swelling, pain, skin lesions, poor healing.  Any of these can lead to the need for revisonal surgery or stage procedures.  A reduction has potential to interfere with diagnostic procedures.  Nipple or breast piercing can increase risks of infection.  This procedure is best done when the breast is fully developed.  Changes in the breast will continue to occur over time.  Pregnancy can alter the outcomes of previous breast reduction surgery, weight gain and weigh loss can also effect the long term appearance.     Electronically signed by: Estefana FORBES Peck, PA-C 12/25/2023 12:54 PM      [1]  Allergies Allergen Reactions   Sulfa Antibiotics Hives   Pseudoephedrine Other (See Comments)    Sudafed causes aggitation  [2]  Current Outpatient Medications:    acetaminophen  (TYLENOL ) 325 MG tablet, 1 tablet as needed Orally every 6 hrs, Disp: , Rfl:    Ascorbic Acid (VITAMIN C) 1000 MG tablet, Take 1,000 mg by mouth daily., Disp: , Rfl:    BIOTIN PO, Take 6,000 mcg by mouth daily., Disp: , Rfl:    Calcium Carbonate Antacid (CALCIUM CARBONATE, DOSED IN MG ELEMENTAL CALCIUM,) 1250 MG/5ML SUSP, Take by mouth., Disp: , Rfl:    Calcium Carbonate-Vitamin D (CALCIUM 600+D PO), Take 1 tablet by mouth daily., Disp: , Rfl:    cetirizine (ZYRTEC) 10 MG tablet, Take 10 mg by mouth daily as needed for allergies., Disp: , Rfl:    drospirenone-ethinyl estradiol (YAZ,GIANVI,LORYNA) 3-0.02 MG tablet, Take 1 tablet by mouth daily., Disp: , Rfl:    fluticasone (FLONASE) 50 MCG/ACT nasal spray, Place 1 spray into both nostrils daily as needed for allergies or rhinitis., Disp: , Rfl:    ibuprofen  (ADVIL ) 200 MG tablet, Take 400 mg by mouth every 6 (six) hours as needed for headache., Disp: , Rfl:    nystatin  cream (MYCOSTATIN ), Apply thin layer to rash in the morning and at night, Disp: 30 g, Rfl: 1   Probiotic Product (ACIDOPHILUS HIGH-POTENCY PO), Take 2 capsules by mouth daily., Disp: , Rfl:     spironolactone (ALDACTONE) 50 MG tablet, Take 50 mg by mouth daily., Disp: , Rfl:    Vitamin D, Cholecalciferol, 10 MCG (400 UNIT) CAPS, Take 400 Units by mouth daily., Disp: , Rfl:

## 2023-12-25 NOTE — H&P (View-Only) (Signed)
 Patient ID: Mackenzie Knight, female    DOB: [birth year], 33 y.o.   MRN: [id number]  Chief Complaint  Patient presents with   Pre-op Exam      ICD-10-CM   1. Macromastia  N62        History of Present Illness: Mackenzie Knight is a 33 y.o.  female  with a history of macromastia.  She presents for preoperative evaluation for upcoming procedure, Bilateral Breast Reduction with liposuction, scheduled for 01/20/2024 with Dr.  Lowery  Patient presents with her mother at bedside.  The patient has had problems with anesthesia.  She reports that she has had some nausea after anesthesia.  Denies any other issues with anesthesia.  Patient does reports she had a lumpectomy about 3 years ago.  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  Patient does report that her grandmother had breast cancer.  Patient denies any history of cardiac disease.  She denies taking any blood thinners.  Patient reports she is not a smoker.  Patient states that she is currently taking birth control pills.  She denies any history of greater than 3 miscarriages.  She denies any personal family history of blood clots or clotting diseases.  She denies any recent surgeries, traumas or infections.  She denies any history of stroke or heart attack.  She denies any history of Crohn's disease or ulcerative colitis, COPD or asthma.  She denies any history of cancer.  She denies any varicosities to her lower extremities.  She reports that she had a cold about 2 weeks ago, but that this has been improving.  She denies any other recent changes in her health.  She denies any current fevers, chills.  Patient reports she is currently a DD cup.  She states that she would like to be around a B/C cup.  Discussed with the patient that cup size cannot be guaranteed.  She expressed understanding.  Summary of Previous Visit: Patient was seen for initial consult by Dr. Lowery on 10/27/2023.  At this visit,  patient complained of upper back and neck pain due to her enlarged breast.  On exam, her STN on the right was 29 cm and her STN on the left was 26 cm.  Her BMI was 25.5 kg/m.  Her preoperative bra size was a DDD cup.  Patient stated that she was hoping to be around a C cup.  The estimated amount of excess breast tissue to be removed at the time of surgery was 200-250 g bilaterally.  Patient was found to be a good candidate for bilateral breast reduction with possible lipo suction.  An ultrasound was ordered to the right breast to make sure that there was not any other tumor in the area.  Per chart review, patient had ultrasound/diagnostic mammogram completed on 11/03/2023 which did not show any evidence of malignancy in either breast.  Estimated excess breast tissue to be removed at time of surgery: 200-250 grams  Job: Does not work at this time  PMH Significant for: Migraine, ADD, macromastia   Past Medical History: Allergies: Allergies[1]  Current Medications: Current Medications[2]  Past Medical Problems: Past Medical History:  Diagnosis Date   ADHD (attention deficit hyperactivity disorder)    Anxiety    Developmental delay    Dizziness    since Feb/March 2023   Dysrhythmia    Febrile seizures (HCC)    as a child, none since 23yrs old, no current problem  GERD (gastroesophageal reflux disease)    no meds, diet   Headache    Hearing loss    sensorineural hearing loss   Hx of migraine headaches    since Feb/March 2023, HA treated with propanolol   Intellectual disability    mild   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   Seasonal allergies     Past Surgical History: Past Surgical History:  Procedure Laterality Date   BREAST EXCISIONAL BIOPSY Right 01/2021   fibroadenoma   Ear Surgeries Right 03/13/2012   reconstruction of ear drum    ingrown toenails Left    RADIOLOGY WITH ANESTHESIA N/A 08/08/2021   Procedure: MRI WITH BRAIN  WITHOUT CONTRAST;  Surgeon: Radiologist, Medication, MD;  Location: [facility] OR;  Service: Radiology;  Laterality: N/A;   TYMPANOPLASTY WO MASTOIDEC; WO OSSICULAR CHAIN  04/23/2012    Social History: Social History   Socioeconomic History   Marital status: Single    Spouse name: Not on file   Number of children: Not on file   Years of education: Not on file   Highest education level: Not on file  Occupational History   Not on file  Tobacco Use   Smoking status: Never    Passive exposure: Never   Smokeless tobacco: Never  Vaping Use   Vaping status: Never Used  Substance and Sexual Activity   Alcohol use: No   Drug use: No   Sexual activity: Never    Birth control/protection: Pill  Other Topics Concern   Not on file  Social History Narrative   Mackenzie Knight is a 33 yo female.   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 : Never true  Transportation Needs: No Transportation Needs (08/11/2022)   Received from [facility]    In the past 12 months, has lack of reliable transportation kept you from medical appointments, meetings, work or from getting things needed for daily living? : No  Physical Activity: Not on file  Stress: Not on file  Social Connections: Not on file  Intimate Partner Violence: Not on file  Depression (EYV7-0): Not on file  Alcohol Screen: Not on file  Housing: Low  Risk (08/11/2022)   Received from [facility]   Epic    What is your living situation today?: I have a steady place to live    Think about the place you live. Do you have problems with any of the following? Choose all that apply:: Not on file  Utilities: Low Risk (08/11/2022)   Received from [facility]   Utilities    In the past 12 months has the electric, gas, oil, or water company threatened to shut off services in your home? :  No  Health Literacy: Not on file    Family History: Family History  Problem Relation Age of Onset   Lupus Mother    Scleroderma Mother    Other Mother        GAVE Disease, Autoimmune Disease, Stomache Bleeds, Low Body Temp, Fatigue   Breast cancer Maternal Grandmother        Died at 56    Review of Systems: Reports cold 2 weeks ago, resolving.  Otherwise denies any recent fevers, chills or changes in her health  Physical Exam: Vital Signs BP 124/86 (BP Location: Right Arm, Patient Position: Sitting, Cuff Size: Normal)   Pulse (!) 106   Wt 118 lb (53.5 kg)   SpO2 100%   BMI 25.09 kg/m   Physical Exam  Constitutional:      General: Not in acute distress.    Appearance: Normal appearance. Not ill-appearing.  HENT:     Head: Normocephalic and atraumatic.  Eyes:     Pupils: Pupils are equal, round Neck:     Musculoskeletal: Normal range of motion.  Cardiovascular:     Rate and Rhythm: Normal rate Pulmonary:     Effort: Pulmonary effort is normal. No respiratory distress.  Musculoskeletal: Normal range of motion.  Skin:    General: Skin is warm and dry.     Findings: No erythema or rash.  Neurological:     Mental Status: Alert and oriented to person, place, and time. Mental status is at baseline.  Psychiatric:        Mood and Affect: Mood normal.        Behavior: Behavior normal.    Assessment/Plan: The patient is scheduled for bilateral breast reduction with Dr. Lowery.  Risks, benefits, and alternatives of procedure discussed,  questions answered and consent obtained.    Smoking Status: Non-smoker; Counseling Given?  N/A Last Mammogram: 11/03/2023; Results: BI-RADS Category 1 negative  Caprini Score: 4; Risk Factors include: Taking oral birth control, BMI > 25, and length of planned surgery. Recommendation for mechanical prophylaxis. Encourage early ambulation.   Pictures obtained: @consult   Post-op Rx sent to pharmacy: Oxycodone, Zofran , Keflex   Instructed the patient to hold any multivitamins, supplements or NSAIDs at least 1 week prior to surgery.  Discussed with her to hold her spironolactone on the day of surgery.  We discussed the risks of estrogen with developing blood clots.  Recommended that she hold her birth control at least 2 weeks before and 2 weeks after surgery.  Patient expressed understanding.  Patient was provided with the breast reduction and General Surgical Risk consent document and Pain Medication Agreement prior to their appointment.  They had adequate time to read through the risk consent documents and Pain Medication Agreement. We also discussed them in person together during this preop appointment. All of their questions were answered to their satisfaction.  Recommended calling if they have any further questions.  Risk consent form and Pain Medication Agreement to be scanned into patient's chart.  The risk that can be encountered with breast reduction were discussed and include the following but not limited to these:  Breast asymmetry, fluid accumulation, firmness of the breast, inability to breast feed, loss of nipple or areola, skin loss, decrease or no nipple sensation, fat necrosis of the breast tissue, bleeding, infection, healing delay.  There are risks of anesthesia, changes to skin sensation and injury to nerves or blood vessels.  The muscle can be temporarily or permanently injured.  You may have an allergic reaction  to tape, suture, glue, blood products which can result in skin  discoloration, swelling, pain, skin lesions, poor healing.  Any of these can lead to the need for revisonal surgery or stage procedures.  A reduction has potential to interfere with diagnostic procedures.  Nipple or breast piercing can increase risks of infection.  This procedure is best done when the breast is fully developed.  Changes in the breast will continue to occur over time.  Pregnancy can alter the outcomes of previous breast reduction surgery, weight gain and weigh loss can also effect the long term appearance.     Electronically signed by: Estefana FORBES Peck, PA-C 12/25/2023 12:54 PM      [1]  Allergies Allergen Reactions   Sulfa Antibiotics Hives   Pseudoephedrine Other (See Comments)    Sudafed causes aggitation  [2]  Current Outpatient Medications:    acetaminophen  (TYLENOL ) 325 MG tablet, 1 tablet as needed Orally every 6 hrs, Disp: , Rfl:    Ascorbic Acid (VITAMIN C) 1000 MG tablet, Take 1,000 mg by mouth daily., Disp: , Rfl:    BIOTIN PO, Take 6,000 mcg by mouth daily., Disp: , Rfl:    Calcium Carbonate Antacid (CALCIUM CARBONATE, DOSED IN MG ELEMENTAL CALCIUM,) 1250 MG/5ML SUSP, Take by mouth., Disp: , Rfl:    Calcium Carbonate-Vitamin D (CALCIUM 600+D PO), Take 1 tablet by mouth daily., Disp: , Rfl:    cetirizine (ZYRTEC) 10 MG tablet, Take 10 mg by mouth daily as needed for allergies., Disp: , Rfl:    drospirenone-ethinyl estradiol (YAZ,GIANVI,LORYNA) 3-0.02 MG tablet, Take 1 tablet by mouth daily., Disp: , Rfl:    fluticasone (FLONASE) 50 MCG/ACT nasal spray, Place 1 spray into both nostrils daily as needed for allergies or rhinitis., Disp: , Rfl:    ibuprofen  (ADVIL ) 200 MG tablet, Take 400 mg by mouth every 6 (six) hours as needed for headache., Disp: , Rfl:    nystatin  cream (MYCOSTATIN ), Apply thin layer to rash in the morning and at night, Disp: 30 g, Rfl: 1   Probiotic Product (ACIDOPHILUS HIGH-POTENCY PO), Take 2 capsules by mouth daily., Disp: , Rfl:     spironolactone (ALDACTONE) 50 MG tablet, Take 50 mg by mouth daily., Disp: , Rfl:    Vitamin D, Cholecalciferol, 10 MCG (400 UNIT) CAPS, Take 400 Units by mouth daily., Disp: , Rfl:

## 2023-12-29 ENCOUNTER — Ambulatory Visit (INDEPENDENT_AMBULATORY_CARE_PROVIDER_SITE_OTHER): Payer: Self-pay | Admitting: Family

## 2024-01-12 ENCOUNTER — Encounter (HOSPITAL_BASED_OUTPATIENT_CLINIC_OR_DEPARTMENT_OTHER): Payer: Self-pay | Admitting: Plastic Surgery

## 2024-01-19 ENCOUNTER — Encounter (HOSPITAL_BASED_OUTPATIENT_CLINIC_OR_DEPARTMENT_OTHER)
Admission: RE | Admit: 2024-01-19 | Discharge: 2024-01-19 | Disposition: A | Discharge status: Home / Self Care | Source: Ambulatory Visit | Attending: Plastic Surgery | Admitting: Plastic Surgery

## 2024-01-19 DIAGNOSIS — Z01818 Encounter for other preprocedural examination: Secondary | ICD-10-CM | POA: Insufficient documentation

## 2024-01-19 LAB — BASIC METABOLIC PANEL WITH GFR
Anion gap: 12 (ref 5–15)
BUN: 9 mg/dL (ref 6–20)
CO2: 24 mmol/L (ref 22–32)
Calcium: 10.1 mg/dL (ref 8.9–10.3)
Chloride: 100 mmol/L (ref 98–111)
Creatinine, Ser: 0.7 mg/dL (ref 0.44–1.00)
GFR, Estimated: >60 mL/min
Glucose, Bld: 71 mg/dL (ref 70–99)
Potassium: 4.4 mmol/L (ref 3.5–5.1)
Sodium: 137 mmol/L (ref 135–145)

## 2024-01-20 ENCOUNTER — Ambulatory Visit (HOSPITAL_BASED_OUTPATIENT_CLINIC_OR_DEPARTMENT_OTHER): Admitting: Anesthesiology

## 2024-01-20 ENCOUNTER — Other Ambulatory Visit: Payer: Self-pay

## 2024-01-20 ENCOUNTER — Encounter (HOSPITAL_BASED_OUTPATIENT_CLINIC_OR_DEPARTMENT_OTHER): Payer: Self-pay | Admitting: Plastic Surgery

## 2024-01-20 ENCOUNTER — Ambulatory Visit (HOSPITAL_BASED_OUTPATIENT_CLINIC_OR_DEPARTMENT_OTHER)
Admission: RE | Admit: 2024-01-20 | Discharge: 2024-01-20 | Disposition: A | Discharge status: Home / Self Care | Attending: Plastic Surgery | Admitting: Plastic Surgery

## 2024-01-20 ENCOUNTER — Encounter (HOSPITAL_BASED_OUTPATIENT_CLINIC_OR_DEPARTMENT_OTHER)
Admission: RE | Disposition: A | Discharge status: Home / Self Care | Payer: Self-pay | Source: Home / Self Care | Attending: Plastic Surgery

## 2024-01-20 DIAGNOSIS — G9349 Other encephalopathy: Secondary | ICD-10-CM | POA: Insufficient documentation

## 2024-01-20 DIAGNOSIS — F419 Anxiety disorder, unspecified: Secondary | ICD-10-CM | POA: Diagnosis not present

## 2024-01-20 DIAGNOSIS — N6489 Other specified disorders of breast: Secondary | ICD-10-CM | POA: Insufficient documentation

## 2024-01-20 DIAGNOSIS — H905 Unspecified sensorineural hearing loss: Secondary | ICD-10-CM | POA: Insufficient documentation

## 2024-01-20 DIAGNOSIS — R6250 Unspecified lack of expected normal physiological development in childhood: Secondary | ICD-10-CM | POA: Diagnosis not present

## 2024-01-20 DIAGNOSIS — Z793 Long term (current) use of hormonal contraceptives: Secondary | ICD-10-CM | POA: Insufficient documentation

## 2024-01-20 DIAGNOSIS — N62 Hypertrophy of breast: Secondary | ICD-10-CM | POA: Insufficient documentation

## 2024-01-20 DIAGNOSIS — M549 Dorsalgia, unspecified: Secondary | ICD-10-CM | POA: Diagnosis not present

## 2024-01-20 DIAGNOSIS — N6031 Fibrosclerosis of right breast: Secondary | ICD-10-CM | POA: Diagnosis not present

## 2024-01-20 DIAGNOSIS — R482 Apraxia: Secondary | ICD-10-CM | POA: Diagnosis not present

## 2024-01-20 DIAGNOSIS — K219 Gastro-esophageal reflux disease without esophagitis: Secondary | ICD-10-CM | POA: Insufficient documentation

## 2024-01-20 DIAGNOSIS — M542 Cervicalgia: Secondary | ICD-10-CM | POA: Insufficient documentation

## 2024-01-20 DIAGNOSIS — Z01818 Encounter for other preprocedural examination: Secondary | ICD-10-CM

## 2024-01-20 HISTORY — DX: Nausea with vomiting, unspecified: Z98.890

## 2024-01-20 HISTORY — PX: BREAST REDUCTION SURGERY: SHX8

## 2024-01-20 LAB — POCT PREGNANCY, URINE: Preg Test, Ur: NEGATIVE

## 2024-01-20 MED ORDER — OXYCODONE HCL 5 MG PO TABS
5.0000 mg | ORAL_TABLET | ORAL | Status: DC | PRN
  Administered 2024-01-20: 5 mg via ORAL

## 2024-01-20 MED ORDER — FENTANYL CITRATE (PF) 100 MCG/2ML IJ SOLN
INTRAMUSCULAR | Status: AC
  Filled 2024-01-20: qty 2

## 2024-01-20 MED ORDER — ACETAMINOPHEN 325 MG PO TABS: 650.0000 mg | ORAL_TABLET | ORAL | Status: DC | PRN

## 2024-01-20 MED ORDER — ACETAMINOPHEN 500 MG PO TABS
ORAL_TABLET | ORAL | Status: AC
  Filled 2024-01-20: qty 2

## 2024-01-20 MED ORDER — SUCCINYLCHOLINE CHLORIDE 200 MG/10ML IV SOSY
PREFILLED_SYRINGE | INTRAVENOUS | Status: AC
  Filled 2024-01-20: qty 10

## 2024-01-20 MED ORDER — SODIUM CHLORIDE 0.9% FLUSH: 3.0000 mL | INTRAVENOUS | Status: DC | PRN

## 2024-01-20 MED ORDER — MIDAZOLAM HCL 5 MG/5ML IJ SOLN
INTRAMUSCULAR | Status: DC | PRN
  Administered 2024-01-20: 2 mg via INTRAVENOUS

## 2024-01-20 MED ORDER — SODIUM CHLORIDE 0.9% FLUSH: 3.0000 mL | Freq: Two times a day (BID) | INTRAVENOUS | Status: DC

## 2024-01-20 MED ORDER — LIDOCAINE-EPINEPHRINE 1 %-1:100000 IJ SOLN
INTRAMUSCULAR | Status: DC | PRN
  Administered 2024-01-20: 15 mL

## 2024-01-20 MED ORDER — LIDOCAINE HCL 1 % IJ SOLN
INTRAVENOUS | Status: DC | PRN
  Administered 2024-01-20: 450 mL

## 2024-01-20 MED ORDER — DEXAMETHASONE SODIUM PHOSPHATE 4 MG/ML IJ SOLN
INTRAMUSCULAR | Status: DC | PRN
  Administered 2024-01-20: 5 mg via INTRAVENOUS

## 2024-01-20 MED ORDER — AMISULPRIDE (ANTIEMETIC) 5 MG/2ML IV SOLN
10.0000 mg | Freq: Once | INTRAVENOUS | Status: AC | PRN
  Administered 2024-01-20: 10 mg via INTRAVENOUS

## 2024-01-20 MED ORDER — PROPOFOL 10 MG/ML IV BOLUS
INTRAVENOUS | Status: DC | PRN
  Administered 2024-01-20: 150 mg via INTRAVENOUS

## 2024-01-20 MED ORDER — AMISULPRIDE (ANTIEMETIC) 5 MG/2ML IV SOLN
INTRAVENOUS | Status: AC
  Filled 2024-01-20: qty 4

## 2024-01-20 MED ORDER — ACETAMINOPHEN 500 MG PO TABS
1000.0000 mg | ORAL_TABLET | Freq: Once | ORAL | Status: AC
  Administered 2024-01-20: 1000 mg via ORAL

## 2024-01-20 MED ORDER — FENTANYL CITRATE (PF) 100 MCG/2ML IJ SOLN
INTRAMUSCULAR | Status: DC | PRN
  Administered 2024-01-20: 100 ug via INTRAVENOUS
  Administered 2024-01-20 (×2): 50 ug via INTRAVENOUS

## 2024-01-20 MED ORDER — VASHE WOUND IRRIGATION OPTIME
TOPICAL | Status: DC | PRN
  Administered 2024-01-20: 34 [oz_av]

## 2024-01-20 MED ORDER — SUGAMMADEX SODIUM 200 MG/2ML IV SOLN
INTRAVENOUS | Status: DC | PRN
  Administered 2024-01-20: 200 mg via INTRAVENOUS

## 2024-01-20 MED ORDER — SCOPOLAMINE 1 MG/3DAYS TD PT72
1.0000 | MEDICATED_PATCH | TRANSDERMAL | Status: DC
  Administered 2024-01-20: 1 mg via TRANSDERMAL

## 2024-01-20 MED ORDER — ROCURONIUM BROMIDE 10 MG/ML (PF) SYRINGE
PREFILLED_SYRINGE | INTRAVENOUS | Status: AC
  Filled 2024-01-20: qty 10

## 2024-01-20 MED ORDER — ATROPINE SULFATE (PF) 0.4 MG/ML IJ SOLN
INTRAMUSCULAR | Status: AC
  Filled 2024-01-20: qty 1

## 2024-01-20 MED ORDER — FENTANYL CITRATE (PF) 100 MCG/2ML IJ SOLN
25.0000 ug | INTRAMUSCULAR | Status: DC | PRN
  Administered 2024-01-20 (×2): 50 ug via INTRAVENOUS

## 2024-01-20 MED ORDER — BUPIVACAINE LIPOSOME 1.3 % IJ SUSP
INTRAMUSCULAR | Status: DC | PRN
  Administered 2024-01-20: 40 mL

## 2024-01-20 MED ORDER — EPHEDRINE 5 MG/ML INJ
INTRAVENOUS | Status: AC
  Filled 2024-01-20: qty 5

## 2024-01-20 MED ORDER — CEFAZOLIN SODIUM-DEXTROSE 2-4 GM/100ML-% IV SOLN
2.0000 g | INTRAVENOUS | Status: AC
  Administered 2024-01-20: 2 g via INTRAVENOUS

## 2024-01-20 MED ORDER — ALBUMIN HUMAN 5 % IV SOLN: INTRAVENOUS | Status: DC | PRN

## 2024-01-20 MED ORDER — HYDROMORPHONE HCL 1 MG/ML IJ SOLN
INTRAMUSCULAR | Status: AC
  Filled 2024-01-20: qty 0.5

## 2024-01-20 MED ORDER — PROPOFOL 500 MG/50ML IV EMUL
INTRAVENOUS | Status: DC | PRN
  Administered 2024-01-20: 150 ug/kg/min via INTRAVENOUS

## 2024-01-20 MED ORDER — OXYCODONE HCL 5 MG PO TABS
ORAL_TABLET | ORAL | Status: AC
  Filled 2024-01-20: qty 1

## 2024-01-20 MED ORDER — ROCURONIUM BROMIDE 100 MG/10ML IV SOLN
INTRAVENOUS | Status: DC | PRN
  Administered 2024-01-20: 50 mg via INTRAVENOUS

## 2024-01-20 MED ORDER — PHENYLEPHRINE 80 MCG/ML (10ML) SYRINGE FOR IV PUSH (FOR BLOOD PRESSURE SUPPORT)
PREFILLED_SYRINGE | INTRAVENOUS | Status: AC
  Filled 2024-01-20: qty 10

## 2024-01-20 MED ORDER — DIPHENHYDRAMINE HCL 50 MG/ML IJ SOLN
INTRAMUSCULAR | Status: DC | PRN
  Administered 2024-01-20: 6.25 mg via INTRAVENOUS

## 2024-01-20 MED ORDER — CHLORHEXIDINE GLUCONATE CLOTH 2 % EX PADS: 6.0000 | MEDICATED_PAD | Freq: Once | CUTANEOUS | Status: DC

## 2024-01-20 MED ORDER — LACTATED RINGERS IV SOLN: INTRAVENOUS | Status: DC

## 2024-01-20 MED ORDER — LIDOCAINE 2% (20 MG/ML) 5 ML SYRINGE
INTRAMUSCULAR | Status: AC
  Filled 2024-01-20: qty 5

## 2024-01-20 MED ORDER — MIDAZOLAM HCL 2 MG/2ML IJ SOLN
INTRAMUSCULAR | Status: AC
  Filled 2024-01-20: qty 2

## 2024-01-20 MED ORDER — SODIUM CHLORIDE 0.9 % IV SOLN: 250.0000 mL | INTRAVENOUS | Status: DC | PRN

## 2024-01-20 MED ORDER — CEFAZOLIN SODIUM-DEXTROSE 2-4 GM/100ML-% IV SOLN
INTRAVENOUS | Status: AC
  Filled 2024-01-20: qty 100

## 2024-01-20 MED ORDER — LIDOCAINE HCL (CARDIAC) PF 100 MG/5ML IV SOSY
PREFILLED_SYRINGE | INTRAVENOUS | Status: DC | PRN
  Administered 2024-01-20: 60 mg via INTRAVENOUS

## 2024-01-20 MED ORDER — ONDANSETRON HCL 4 MG/2ML IJ SOLN
INTRAMUSCULAR | Status: AC
  Filled 2024-01-20: qty 2

## 2024-01-20 MED ORDER — FENTANYL CITRATE (PF) 100 MCG/2ML IJ SOLN: 25.0000 ug | INTRAMUSCULAR | Status: DC | PRN

## 2024-01-20 MED ORDER — HYDROMORPHONE HCL 1 MG/ML IJ SOLN
INTRAMUSCULAR | Status: DC | PRN
  Administered 2024-01-20: .5 mg via INTRAVENOUS

## 2024-01-20 MED ORDER — ACETAMINOPHEN 325 MG RE SUPP: 650.0000 mg | RECTAL | Status: DC | PRN

## 2024-01-20 MED ORDER — SCOPOLAMINE 1 MG/3DAYS TD PT72
MEDICATED_PATCH | TRANSDERMAL | Status: AC
  Filled 2024-01-20: qty 1

## 2024-01-20 MED ORDER — ONDANSETRON HCL 4 MG/2ML IJ SOLN: 4.0000 mg | Freq: Once | INTRAMUSCULAR | Status: DC | PRN

## 2024-01-20 NOTE — Anesthesia Preprocedure Evaluation (Addendum)
"                                    Anesthesia Evaluation  Patient identified by MRN, date of birth, ID band Patient awake    Reviewed: Allergy & Precautions, NPO status , Patient's Chart, lab work & pertinent test results  History of Anesthesia Complications (+) PONV and history of anesthetic complications  Airway Mallampati: I  TM Distance: >3 FB Neck ROM: Full    Dental  (+) Teeth Intact, Dental Advisory Given   Pulmonary neg pulmonary ROS   Pulmonary exam normal breath sounds clear to auscultation       Cardiovascular Exercise Tolerance: Good negative cardio ROS Normal cardiovascular exam Rhythm:Regular Rate:Normal     Neuro/Psych  Headaches PSYCHIATRIC DISORDERS Anxiety     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  Hypertrophy of breast  Reproductive/Obstetrics                              Anesthesia Physical Anesthesia Plan  ASA: 3  Anesthesia Plan: General   Post-op Pain Management: Tylenol  PO (pre-op)* and Toradol IV (intra-op)*   Induction: Intravenous  PONV Risk Score and Plan: 4 or greater and Scopolamine  patch - Pre-op, Midazolam , Dexamethasone  and Ondansetron   Airway Management Planned: Oral ETT  Additional Equipment:   Intra-op Plan:   Post-operative Plan: Extubation in OR  Informed Consent: I have reviewed the patients History and Physical, chart, labs and discussed the procedure including the risks, benefits and alternatives for the proposed anesthesia with the patient or authorized representative who has indicated his/her  understanding and acceptance.     Dental advisory given  Plan Discussed with: CRNA  Anesthesia Plan Comments:          Anesthesia Quick Evaluation  "

## 2024-01-20 NOTE — Anesthesia Procedure Notes (Signed)
 Procedure Name: Intubation Date/Time: 01/20/2024 10:59 AM  Performed by: Emilio Rock BIRCH, CRNAPre-anesthesia Checklist: Patient identified, Emergency Drugs available, Suction available and Patient being monitored Patient Re-evaluated:Patient Re-evaluated prior to induction Oxygen Delivery Method: Circle system utilized Preoxygenation: Pre-oxygenation with 100% oxygen Induction Type: IV induction Ventilation: Mask ventilation without difficulty Laryngoscope Size: Mac and 3 Grade View: Grade I Tube type: Oral Tube size: 6.5 mm Number of attempts: 1 Airway Equipment and Method: Stylet and Oral airway Placement Confirmation: ETT inserted through vocal cords under direct vision, positive ETCO2 and breath sounds checked- equal and bilateral Secured at: 20 cm Tube secured with: Tape Dental Injury: Teeth and Oropharynx as per pre-operative assessment

## 2024-01-20 NOTE — Anesthesia Postprocedure Evaluation (Signed)
"   Anesthesia Post Note  Patient: Mackenzie Knight  Procedure(s) Performed: BREAST REDUCTION WITH LIPOSUCTION (Bilateral: Breast)     Patient location during evaluation: PACU Anesthesia Type: General Level of consciousness: awake and alert Pain management: pain level controlled Vital Signs Assessment: post-procedure vital signs reviewed and stable Respiratory status: spontaneous breathing, nonlabored ventilation and respiratory function stable Cardiovascular status: blood pressure returned to baseline, stable and tachycardic Postop Assessment: no apparent nausea or vomiting Anesthetic complications: no   No notable events documented.  Last Vitals:  Vitals:   01/20/24 1330 01/20/24 1446  BP: 113/74 122/82  Pulse: (!) 113 (!) 110  Resp: 19   Temp:  36.6 C  SpO2: 100% 100%    Last Pain:  Vitals:   01/20/24 1446  TempSrc: Temporal  PainSc:                  Garnette FORBES Skillern      "

## 2024-01-20 NOTE — Discharge Instructions (Addendum)
 INSTRUCTIONS FOR AFTER BREAST SURGERY   You will likely have some questions about what to expect following your operation.  The following information will help you and your family understand what to expect when you are discharged from the hospital.  It is important to follow these guidelines to help ensure a smooth recovery and reduce complication.  Postoperative instructions include information on: diet, wound care, medications and physical activity.  AFTER SURGERY Expect to go home after the procedure.  In some cases, you may need to spend one night in the hospital for observation.  DIET Breast surgery does not require a specific diet.  However, the healthier you eat the better your body will heal. It is important to increasing your protein intake.  This means limiting the foods with sugar and carbohydrates.  Focus on vegetables and some meat.  If you have liposuction during your procedure be sure to drink water.  If your urine is bright yellow, then it is concentrated, and you need to drink more water.  As a general rule after surgery, you should have 8 ounces of water every hour while awake.  If you find you are persistently nauseated or unable to take in liquids let us  know.  NO TOBACCO USE or EXPOSURE.  This will slow your healing process and lead to a wound.  WOUND CARE Leave the binder on for 3 days . Use fragrance free soap like Dial, Dove or Ivory.   After 3 days you can remove the binder to shower. Once dry apply binder or sports bra. If you have liposuction you will have a soft and spongy dressing (Lipofoam) that helps prevent creases in your skin.  Remove before you shower and then replace it.  It is also available on [employer]. If you have steri-strips / tape directly attached to your skin leave them in place. It is OK to get these wet.   No baths, pools or hot tubs for four weeks. We close your incision to leave the smallest and best-looking scar. No ointment or creams on your incisions  for four weeks.  No Neosporin (Too many skin reactions).  A few weeks after surgery you can use Mederma and start massaging the scar. We ask you to wear your binder or sports bra for the first 6 weeks around the clock, including while sleeping. This provides added comfort and helps reduce the fluid accumulation at the surgery site. NO Ice or heating pads to the operative site.  You have a very high risk of a BURN before you feel the temperature change.  ACTIVITY No heavy lifting until cleared by the doctor.  This usually means no more than a half-gallon of milk.  It is OK to walk and climb stairs. Moving your legs is very important to decrease your risk of a blood clot.  It will also help keep you from getting deconditioned.  Every 1 to 2 hours get up and walk for 5 minutes. This will help with a quicker recovery back to normal.  Let pain be your guide so you don't do too much.  This time is for you to recover.  You will be more comfortable if you sleep and rest with your head elevated either with a few pillows under you or in a recliner.  No stomach sleeping for a three months.  WORK Everyone returns to work at different times. As a rough guide, most people take at least 1 - 2 weeks off prior to returning to work. If  you need documentation for your job, give the forms to the front staff at the clinic.  DRIVING Arrange for someone to bring you home from the hospital after your surgery.  You may be able to drive a few days after surgery but not while taking any narcotics or valium.  BOWEL MOVEMENTS Constipation can occur after anesthesia and while taking pain medication.  It is important to stay ahead for your comfort.  We recommend taking Milk of Magnesia (2 tablespoons; twice a day) while taking the pain pills.  MEDICATIONS You may be prescribed should start after surgery At your preoperative visit for you history and physical you were given the following medications: Antibiotic: Start this  medication when you get home and take according to the instructions on the bottle. Zofran  4 mg:  This is to treat nausea and vomiting.  You can take this every 6 hours as needed and only if needed. Oxycodone  5 mg every 6 hours for 3 - 5 days.  This is to be used after you have taken the Motrin  or the Tylenol . 4.   Gabapentin 300 mg every 12 hours for 7 days. DO NOT TAKE PER DR LOWERY  Over the counter Medication to take: Ibuprofen  (Motrin ) 400 - 600 mg every 6 hour for 7 days Tylenol  500 mg every 6 hours for 7 days.  Only take the Oxycodone  after you have tried these two. MiraLAX or stool softener of choice: Take this according to the bottle if you take the Norco.  If muscle work done:  Flexeril 5 mg every 12 hours for 7 days.  WHEN TO CALL Call your surgeon's office if any of the following occur: Fever 101 degrees F or greater Excessive bleeding or fluid from the incision site. Pain that increases over time without aid from the medications Redness, warmth, or pus draining from incision sites Persistent nausea or inability to take in liquids Severe misshapen area that underwent the operation.   Post Anesthesia Home Care Instructions  Activity: Get plenty of rest for the remainder of the day. A responsible individual must stay with you for 24 hours following the procedure.  For the next 24 hours, DO NOT: -Drive a car -Advertising copywriter -Drink alcoholic beverages -Take any medication unless instructed by your physician -Make any legal decisions or sign important papers.  Meals: Start with liquid foods such as gelatin or soup. Progress to regular foods as tolerated. Avoid greasy, spicy, heavy foods. If nausea and/or vomiting occur, drink only clear liquids until the nausea and/or vomiting subsides. Call your physician if vomiting continues.  Special Instructions/Symptoms: Your throat may feel dry or sore from the anesthesia or the breathing tube placed in your throat during  surgery. If this causes discomfort, gargle with warm salt water. The discomfort should disappear within 24 hours.  If you had a scopolamine  patch placed behind your ear for the management of post- operative nausea and/or vomiting:  1. The medication in the patch is effective for 72 hours, after which it should be removed.  Wrap patch in a tissue and discard in the trash. Wash hands thoroughly with soap and water. 2. You may remove the patch earlier than 72 hours if you experience unpleasant side effects which may include dry mouth, dizziness or visual disturbances. 3. Avoid touching the patch. Wash your hands with soap and water after contact with the patch.    *May have Tylenol  today after 4pm*  Information for Discharge Teaching: EXPAREL  (bupivacaine  liposome injectable suspension)  Pain relief is important to your recovery. The goal is to control your pain so you can move easier and return to your normal activities as soon as possible after your procedure. Your physician may use several types of medicines to manage pain, swelling, and more.  Your surgeon or anesthesiologist gave you EXPAREL (bupivacaine ) to help control your pain after surgery.  EXPAREL  is a local anesthetic designed to release slowly over an extended period of time to provide pain relief by numbing the tissue around the surgical site. EXPAREL  is designed to release pain medication over time and can control pain for up to 72 hours. Depending on how you respond to EXPAREL , you may require less pain medication during your recovery. EXPAREL  can help reduce or eliminate the need for opioids during the first few days after surgery when pain relief is needed the most. EXPAREL  is not an opioid and is not addictive. It does not cause sleepiness or sedation.   Important! A teal colored band has been placed on your arm with the date, time and amount of EXPAREL  you have received. Please leave this armband in place for the full 96  hours following administration, and then you may remove the band. If you return to the hospital for any reason within 96 hours following the administration of EXPAREL , the armband provides important information that your health care providers to know, and alerts them that you have received this anesthetic.    Possible side effects of EXPAREL : Temporary loss of sensation or ability to move in the area where medication was injected. Nausea, vomiting, constipation Rarely, numbness and tingling in your mouth or lips, lightheadedness, or anxiety may occur. Call your doctor right away if you think you may be experiencing any of these sensations, or if you have other questions regarding possible side effects.  Follow all other discharge instructions given to you by your surgeon or nurse. Eat a healthy diet and drink plenty of water or other fluids.

## 2024-01-20 NOTE — Interval H&P Note (Signed)
 History and Physical Interval Note:  01/20/2024 10:26 AM  Mackenzie Knight  has presented today for surgery, with the diagnosis of n62.  The various methods of treatment have been discussed with the patient and family. After consideration of risks, benefits and other options for treatment, the patient has consented to  Procedures: BREAST REDUCTION WITH LIPOSUCTION (Bilateral) as a surgical intervention.  The patient's history has been reviewed, patient examined, no change in status, stable for surgery.  I have reviewed the patient's chart and labs.  Questions were answered to the patient's satisfaction.     Mackenzie Knight

## 2024-01-20 NOTE — Op Note (Signed)
 Breast Reduction Op note:    DATE OF PROCEDURE: 01/20/2024  LOCATION: [facility]  SURGEON: Estefana Fritter, DO  PREOPERATIVE DIAGNOSIS 1. Macromastia 2. Neck Pain / Back pain 3. Breast asymmetry  POSTOPERATIVE DIAGNOSIS Same as preoperative diagnosis  PROCEDURES 1. Bilateral breast reduction.  Right reduction 442 g, Left reduction 302 g  COMPLICATIONS: None.  INDICATIONS FOR PROCEDURE Mackenzie Knight is a 34 y.o. year-old female born on [birth year],with a history of symptomatic macromastia with concomitant back pain, neck pain, shoulder grooving from her bra. She also had asymmetry.   MRN: [id number]  CONSENT Informed consent was obtained directly from the patient. The risks, benefits and alternatives were fully discussed. Specific risks including but not limited to bleeding, infection, hematoma, seroma, scarring, pain, nipple necrosis, asymmetry, poor cosmetic results, and need for further surgery were discussed. The patient's questions were answered.  DESCRIPTION OF PROCEDURE  Patient was brought into the operating room and rested on the operating room table in the supine position.  SCDs were placed and appropriate padding was performed.  Antibiotics were given. The patient underwent general anesthesia and the chest was prepped and draped in a sterile fashion.  A timeout was performed and all information was confirmed to be correct by those in the room. Tumescent was placed in the breasts laterally and liposuction done for improved symmetry.    Right side: Preoperative markings were confirmed.  Incision lines were injected with local containing epinephrine .  After waiting for vasoconstriction, the marked lines were incised with a #15 blade.  A superomedial breast reduction was performed by de-epithelializing the pedicle, using bovie to create the superomedial pedicle, and removing breast tissue from the superior, lateral, and inferior portions of the  breast.  Care was taken to not undermine the breast pedicle. Hemostasis was achieved.  Experel and myriad placed in the pocket. The nipple was gently rotated into position and the soft tissue closed with 4-0 Monocryl.   The pocket was irrigated and hemostasis confirmed.  The deep tissues were approximated with 3-0 PDS sutures.  The skin was closed with deep dermal 3-0 Monocryl and subcuticular 4-0 Monocryl sutures.  The nipple and skin flaps had good capillary refill at the end of the procedure.    Left side: Preoperative markings were confirmed.  Incision lines were injected with local containing epinephrine .  After waiting for vasoconstriction, the marked lines were incised with a #15 blade.  A superomedial breast reduction was performed by de-epithelializing the pedicle, using bovie to create the superomedial pedicle, and removing breast tissue from the inferior portion of the breast.  Care was taken to not undermine the breast pedicle. Hemostasis was achieved.  The nipple was gently lifted into position and the soft tissue was closed with 4-0 Monocryl.  The patient was sat upright and size and shape symmetry was confirmed.  The pocket was irrigated and hemostasis confirmed.  The deep tissues were approximated with 3-0 PDS sutures. The skin was closed with deep dermal 3-0 Monocryl and subcuticular 4-0 Monocryl sutures.  Dermabond was applied.  A breast binder and ABDs were placed.  The nipple and skin flaps had good capillary refill at the end of the procedure.  The patient tolerated the procedure well. The patient was allowed to wake from anesthesia and taken to the recovery room in satisfactory condition. SABRA

## 2024-01-20 NOTE — Transfer of Care (Signed)
 Immediate Anesthesia Transfer of Care Note  Patient: SHERYLANN VANGORDEN  Procedure(s) Performed: BREAST REDUCTION WITH LIPOSUCTION (Bilateral: Breast)  Patient Location: PACU  Anesthesia Type:General  Level of Consciousness: awake, alert , oriented, drowsy, and patient cooperative  Airway & Oxygen Therapy: Patient Spontanous Breathing and Patient connected to face mask oxygen  Post-op Assessment: Report given to RN and Post -op Vital signs reviewed and stable  Post vital signs: Reviewed and stable  Last Vitals:  Vitals Value Taken Time  BP    Temp    Pulse 111 01/20/24 12:43  Resp 17 01/20/24 12:43  SpO2 100 % 01/20/24 12:43  Vitals shown include unfiled device data.  Last Pain:  Vitals:   01/20/24 0925  TempSrc: Temporal  PainSc: 5          Complications: No notable events documented.

## 2024-01-21 ENCOUNTER — Encounter (HOSPITAL_BASED_OUTPATIENT_CLINIC_OR_DEPARTMENT_OTHER): Payer: Self-pay | Admitting: Plastic Surgery

## 2024-01-22 LAB — SURGICAL PATHOLOGY

## 2024-01-25 ENCOUNTER — Encounter: Payer: Self-pay | Admitting: Plastic Surgery

## 2024-01-25 ENCOUNTER — Telehealth: Payer: Self-pay | Admitting: Plastic Surgery

## 2024-01-25 NOTE — Telephone Encounter (Signed)
 Pts mom called and stated that she had called the clinical line and left message, I did let them know they were all in clinic. I went back to ask Mackenzie Knight and she was able to take the call  Pt mom stated that pts nipple was extremely red and had dressing questions on what to do for the clinical staff

## 2024-01-25 NOTE — Telephone Encounter (Signed)
 I called the mom back who is listed on patients DPR. She stated the patients nipple was red, swollen, and hot to the touch. She also said the one breast looked larger than the other. Patient is added to the schedule for tomorrow at 8:30am.

## 2024-01-26 ENCOUNTER — Ambulatory Visit: Admitting: Student

## 2024-01-26 VITALS — BP 120/80 | HR 101

## 2024-01-26 DIAGNOSIS — N62 Hypertrophy of breast: Secondary | ICD-10-CM

## 2024-01-26 NOTE — Progress Notes (Unsigned)
 Patient is a 34 year old female who recently underwent bilateral breast reduction with Dr. Lowery on 01/20/2024.  She is 6 days out from her procedure.  She presents to the clinic today with concerns about her right nipple.  Today, patient presents with her mother at bedside.  She reports overall she is doing okay, but mother reports that yesterday, they noticed that the right nipple was a little bit more red and swollen in comparison to the left nipple.  Patient reports some mild discomfort on the right side, but reports that the pain has not been worsening.  She denies any drainage from that side.  She denies any fevers or chills.  Patient reports that she has been up and ambulating without issue as well as eating and drinking without issue.  She denies any nausea or vomiting.  Patient reports has been having bowel movements.  Chaperone present on exam.  On exam, patient sitting upright in no acute distress.  Breasts are overall soft and symmetric.  There is some overlying ecchymosis noted bilaterally, more so to the sides laterally.  There is no overlying erythema.  No obvious fluid collection on exam.  Left NAC appears to be healthy with sensation intact.  There is good capillary refill noted.  Right NAC does appear to be somewhat congested.  There does appear to be some duskiness noted to the lateral aspect, could be consistent with possible ischemia.  Sensation is minimally intact to the right NAC.  There is good capillary refill noted to the majority of the right NAC.  Mepilex border dressings bilaterally appear to be wet underneath.  These were removed.  Steri-Strip to the superior aspect of the vertical limb incision was soiled with drainage.  This was removed.  Incision underneath appears to be mildly irritated.  There are no signs of infection on exam.  Discussed with the patient and her mother that it appears patient may have some ischemia to the right lateral nipple.  Discussed with them that  this will most likely develop into a wound and we will have to do wound care to the area.  Discussed with him though that we will need the ischemia and nipple to declare itself.  They expressed understanding.  Recommended that they apply Vaseline daily to the area of irritation to the superior vertical limb incision.  They expressed understanding.  Discussed with patient that she may transition to a sports bra at this time.  Discussed that she must wear compression at all times except when she is showering.  They expressed understanding.  Patient to follow-up at her scheduled appointment this Friday.  Instructed them to call if they have any questions or concerns about anything.  Pictures were obtained of the patient and placed in the chart with the patient's or guardian's permission.

## 2024-01-28 ENCOUNTER — Encounter: Payer: Self-pay | Admitting: Plastic Surgery

## 2024-01-28 NOTE — Telephone Encounter (Signed)
 Attempted to call the patient to discuss the pictures that were sent. There was no answer so I left a message for her to call the office back. I will attempt to send a MyChart message.

## 2024-01-28 NOTE — Telephone Encounter (Signed)
 I was able to speak with mom on the phone. I assured her that she was doing the right things with placing the vaseline on the area and covering it with a nonstick. I answered her questions and patient is due in for an appointment tomorrow 01/29/2024.

## 2024-01-29 ENCOUNTER — Ambulatory Visit: Admitting: Plastic Surgery

## 2024-01-29 ENCOUNTER — Encounter: Payer: Self-pay | Admitting: Plastic Surgery

## 2024-01-29 DIAGNOSIS — N62 Hypertrophy of breast: Secondary | ICD-10-CM

## 2024-01-29 NOTE — Progress Notes (Signed)
 The patient is a lovely 34 year old female here for follow-up after undergoing bilateral breast reduction.  She has a little bit of blue discoloration of the right nipple on the lateral aspect.  This is probably going to slough some and will heal secondarily.  I gave her some donated Urgo dressings to put on it every 2 days.  She can wash in between her dressing changes.  When she runs out continue with Vaseline.  I would like to see her back in 2 weeks.

## 2024-02-02 ENCOUNTER — Encounter: Payer: Self-pay | Admitting: Plastic Surgery

## 2024-02-04 NOTE — Telephone Encounter (Signed)
 I called mom back and relayed the message from Dr. Lowery. They will be in tomorrow to pick up the UrgoClean.

## 2024-02-05 ENCOUNTER — Encounter: Payer: Self-pay | Admitting: Plastic Surgery

## 2024-02-05 MED ORDER — DOXYCYCLINE HYCLATE 100 MG PO TABS: 100.0000 mg | ORAL_TABLET | Freq: Two times a day (BID) | ORAL | 0 refills | Status: AC

## 2024-02-06 MED ORDER — CEPHALEXIN 500 MG PO CAPS: 500.0000 mg | ORAL_CAPSULE | Freq: Four times a day (QID) | ORAL | 0 refills | Status: AC

## 2024-02-08 NOTE — Progress Notes (Unsigned)
 "   Mackenzie Knight   MRN:  [id number]  07-21-1990   Provider: Ellouise Bollman NP-C Location of Care: 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 She has been experiencing intermittent headaches and dizziness since about February or March, 2023. She enjoys activities with the [employer] [place] and [address] surfing.  Since last visit: Mackenzie Knight reports today that headaches have not been problematic since her last visit.  Her grandfather passed away at the end of last year. He lived in the home and their relationship could sometimes be contentious.  Mackenzie Knight had breast reduction surgery earlier this month and is having some difficulties with recovery. She has a follow up appointment with her surgeon at the end of this week. Mackenzie Knight has been otherwise generally healthy since she was last seen. No health concerns today other than previously mentioned.  Review of systems: Please see HPI for neurologic and other pertinent review of systems. Otherwise all other systems were reviewed and were negative.  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   Episodic tension type headache 08/10/2014    Fatigue 08/09/2014   Dizziness 07/20/2013   Deafness, sensorineural 09/01/2012   Obsessive-compulsive disorder 05/25/2012   Attention deficit disorder 05/25/2012   Lack of coordination 05/25/2012   Dysarthria 05/25/2012   Apraxia 05/25/2012   Conductive hearing loss, middle ear 12/23/2011     Past Medical History:  Diagnosis Date   ADHD (attention deficit hyperactivity disorder)    Anxiety    Developmental delay    Dizziness    since Feb/March 2023   Febrile seizures (HCC)    as a child, none since 61yrs old, no current problem   GERD (gastroesophageal reflux disease)    no meds, diet   Headache    Hearing loss    sensorineural hearing loss   Hx of migraine headaches    since Feb/March 2023, HA treated with propanolol   Intellectual disability    mild   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 Dr. Arby at [facility] wanted to schedual eardrum reconstuction surgery, she has had more hearing loss recently in the right ear.    Her highest developmental quotient as a toddler was 78. When last tested before age 51 it was 77. In March 1996 the patient had episodes of unresponsive staring. EEG at 18 months was normal in the waking state. She was evaluated  by Dr. Garrel Auerbach, child neurologist at [facility]. 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 No further workup was recommended. She has not had any imaging studies of her brain nor chromosomal studies.    She was given growth hormone at [facility] in an attempt to help her growth. Once off growth hormone she  gained 8 pounds in 3 months. She had an irregular menstrual cycle.    She has had extensive physical occupational and speech therapy since she was a toddler. She has a prominent nose and forehead, slightly receding chin line, and short stature. As a young child she had severe dysarthria to the point of being unintelligible.    She had problems with mood, anxiety, difficulty sleeping, constipation, environmental allergies, and acne.   Behavior History labile mood, and anxiety, obsessive-compulsive behavior  Surgical history: Past Surgical History:  Procedure Laterality Date   BREAST EXCISIONAL BIOPSY Right 01/2021   fibroadenoma   BREAST REDUCTION SURGERY Bilateral 01/20/2024   Procedure: BREAST REDUCTION WITH LIPOSUCTION;  Surgeon: Lowery Mackenzie Knight RAMAN, DO;  Location: [facility];  Service: Plastics;  Laterality: Bilateral;   Ear Surgeries Right 03/13/2012   reconstruction of ear drum    ingrown toenails Left    RADIOLOGY WITH ANESTHESIA N/A 08/08/2021   Procedure: MRI WITH BRAIN WITHOUT CONTRAST;  Surgeon: Radiologist, Medication, MD;  Location: [facility] OR;  Service: Radiology;  Laterality: N/A;   TYMPANOPLASTY WO MASTOIDEC; WO OSSICULAR CHAIN  04/23/2012     Family history: family history includes Breast cancer in her maternal grandmother; Lupus in her mother; Other in her mother; Scleroderma in her mother.   Social history: Social History   Socioeconomic History   Marital status: Single    Spouse name: Not on file   Number of children: Not on file   Years of education: Not on file   Highest education level: Not on file  Occupational History   Not on file  Tobacco Use   Smoking status: Never    Passive exposure: Never   Smokeless tobacco: Never  Vaping Use   Vaping status: Never Used  Substance and Sexual Activity   Alcohol use: No   Drug use: No   Sexual activity: Never    Birth control/protection: Pill  Other Topics Concern   Not on file  Social History  Narrative   Mackenzie Knight is a 34 yo female.   She is a engineer, agricultural.   She lives with her mother grandad, dad    2 dogs   Social Drivers of Health   Tobacco Use: Low Risk (01/20/2024)   Patient History    Smoking Tobacco Use: Never    Smokeless Tobacco Use: Never    Passive Exposure: Never  Financial Resource Strain: Not on file  Food Insecurity: Low Risk (08/11/2022)   Received from [facility]   Epic    Within the past 12 months, you worried that your food would run out before you got money to buy more: Never true    Within the past 12 months, the food you bought just didn't last and you didn't have money to get more. : Never true  Transportation Needs: No Transportation Needs (08/11/2022)   Received from [facility]    In the past 12 months, has lack of reliable transportation kept you from medical appointments, meetings, work or from getting things needed for daily living? : No  Physical Activity: Not on file  Stress: Not on file  Social Connections: Not on file  Intimate Partner Violence: Not on file  Depression (EYV7-0): Not on file  Alcohol Screen: Not on file  Housing: Low Risk (08/11/2022)   Received from [facility]   Epic    What is your living situation today?: I have a steady place to live    Think about the place you live. Do you have problems with any of the following? Choose all that apply:: Not on file  Utilities: Low Risk (08/11/2022)   Received from [facility]   Utilities    In the past 12 months has the electric, gas, oil, or water company threatened to shut off services in your home? : No  Health Literacy: Not on file    Past/failed meds: Copied from previous record: Sertraline  not helpful for mood Propranolol  not helpful for headache Emgality  not helpful for headache   Allergies: Allergies[1]   Immunizations:  There is no immunization history on file for this patient.   Diagnostics/Screenings: Copied from previous  record: Overnight pulse oximetry - report pending   08/08/2021 - MRI brain wo contrast - Unremarkable non-contrast MRI appearance of the brain. No evidence of acute intracranial abnormality. Paranasal sinus disease, as described.   Physical Exam: BP 110/70 (BP Location: Left Arm, Patient Position: Sitting, Cuff Size: Small)   Pulse 80   Ht 4' 10.66 (1.49 m)   Wt 118 lb (53.5 kg)   LMP 01/22/2024   BMI 24.11 kg/m   General: Well developed, well nourished woman, seated on exam table, in no evident distress Head: Head normocephalic and atraumatic.  Oropharynx benign. Neck: Supple Cardiovascular: Regular rate and rhythm, no murmurs Respiratory: Breath sounds clear to auscultation Musculoskeletal: No obvious deformities or scoliosis Skin: No rashes or neurocutaneous lesions  Neurologic Exam Mental Status: Awake and fully alert.  Oriented to place and time.  Recent and remote memory intact. Fund of knowledge subnormal for age. Speech with dysartria.  Mood and affect appropriate. Cranial Nerves: Fundoscopic exam reveals red reflex.  Pupils equal, briskly reactive to light.  Extraocular movements full without nystagmus. Has hearing aids. Facial sensation intact.  Face tongue, palate move normally and symmetrically. Shoulder shrug normal Motor: Normal bulk and tone. Normal strength in all tested extremity muscles. Sensory: Intact to touch and temperature in all extremities.  Coordination: No dysmetria with reach for objects Gait and Station: Arises from chair without difficulty.  Stance is normal. Gait demonstrates normal stride length and balance.     Impression: Migraine without aura and without status migrainosus, not intractable  Mild intellectual disability  Dysarthria  Conductive hearing loss, middle ear  Episodic tension-type headache, not intractable   Recommendations for plan of care: The patient's previous Epic records were reviewed. No recent diagnostic studies to be  reviewed with the patient. Mackenzie Knight is doing well at this time. I will see her back in follow up in 6 months or sooner if needed.   Recommendations and plan until next visit: Continue medications as prescribed  Reminded to avoid skipped meals, to drink plenty of water each day and to get at least hours of sleep each night.  Call for questions or concerns Return in about 6 months (around 08/08/2024).  The medication list was reviewed and reconciled. No changes were made in the prescribed medications today. A complete medication list was provided to the patient.  Allergies as of 02/09/2024       Reactions  Sulfa Antibiotics Hives   Pseudoephedrine Other (See Comments)   Sudafed causes aggitation   Betadine [povidone-iodine] Rash        Medication List        Accurate as of February 09, 2024  3:35 PM. If you have any questions, ask your nurse or doctor.          STOP taking these medications    nystatin  cream Commonly known as: MYCOSTATIN  Stopped by: Ellouise Bollman, NP       TAKE these medications    ACIDOPHILUS HIGH-POTENCY PO Take 2 capsules by mouth daily.   BIOTIN PO Take 6,000 mcg by mouth daily.   CALCIUM 600+D PO Take 1 tablet by mouth daily.   calcium carbonate (dosed in mg elemental calcium) 1250 MG/5ML Susp Take by mouth.   cephALEXin  500 MG capsule Commonly known as: KEFLEX  Take 1 capsule (500 mg total) by mouth 4 (four) times daily for 5 days.   cetirizine 10 MG tablet Commonly known as: ZYRTEC Take 10 mg by mouth daily as needed for allergies.   doxycycline  100 MG tablet Commonly known as: VIBRA -TABS Take 1 tablet (100 mg total) by mouth 2 (two) times daily for 5 days.   drospirenone-ethinyl estradiol 3-0.02 MG tablet Commonly known as: YAZ Take 1 tablet by mouth daily.   fluticasone 50 MCG/ACT nasal spray Commonly known as: FLONASE Place 1 spray into both nostrils daily as needed for allergies or rhinitis.   ibuprofen  200 MG  tablet Commonly known as: ADVIL  Take 400 mg by mouth every 6 (six) hours as needed for headache.   ondansetron  4 MG tablet Commonly known as: Zofran  Take 1 tablet (4 mg total) by mouth every 8 (eight) hours as needed for up to 15 doses for nausea or vomiting.   oxyCODONE  5 MG immediate release tablet Commonly known as: Roxicodone  Take 1 tablet (5 mg total) by mouth every 6 (six) hours as needed for up to 15 doses for severe pain (pain score 7-10).   spironolactone 50 MG tablet Commonly known as: ALDACTONE Take 50 mg by mouth daily.   Tylenol  325 MG tablet Generic drug: acetaminophen  1 tablet as needed Orally every 6 hrs   vitamin C 1000 MG tablet Take 1,000 mg by mouth daily.   Vitamin D (Cholecalciferol) 10 MCG (400 UNIT) Caps Take 400 Units by mouth daily.      I spent 20 minutes caring for the patient today face to face reviewing records, including previous charts and test results, examination of the patient, discussion and education with the patient and her mother about her condition, documentation in her chart, and developing a plan of care.  Ellouise Bollman NP-C [facility] [address] [place], [place] [address] Ph. [phone] Fax [phone]           [1]  Allergies Allergen Reactions   Sulfa Antibiotics Hives   Pseudoephedrine Other (See Comments)    Sudafed causes aggitation   Betadine [Povidone-Iodine] Rash   "

## 2024-02-09 ENCOUNTER — Encounter (INDEPENDENT_AMBULATORY_CARE_PROVIDER_SITE_OTHER): Payer: Self-pay | Admitting: Family

## 2024-02-09 ENCOUNTER — Encounter: Payer: Self-pay | Admitting: Plastic Surgery

## 2024-02-09 ENCOUNTER — Ambulatory Visit (INDEPENDENT_AMBULATORY_CARE_PROVIDER_SITE_OTHER): Admitting: Family

## 2024-02-09 VITALS — BP 110/70 | HR 80 | Ht 58.66 in | Wt 118.0 lb

## 2024-02-09 DIAGNOSIS — F70 Mild intellectual disabilities: Secondary | ICD-10-CM

## 2024-02-09 DIAGNOSIS — G43009 Migraine without aura, not intractable, without status migrainosus: Secondary | ICD-10-CM

## 2024-02-09 DIAGNOSIS — G44219 Episodic tension-type headache, not intractable: Secondary | ICD-10-CM | POA: Diagnosis not present

## 2024-02-09 DIAGNOSIS — R471 Dysarthria and anarthria: Secondary | ICD-10-CM

## 2024-02-09 DIAGNOSIS — H902 Conductive hearing loss, unspecified: Secondary | ICD-10-CM

## 2024-02-09 NOTE — Telephone Encounter (Signed)
 I called the patient's mother and reviewed the photos that she sent over MyChart.  She states that the patient was started on doxycycline , but patient did not tolerate doxycycline  and was switched to Keflex .  Patient's mother states that the wound to the superior vertical limb incision to the right breast was looking okay, but she was concerned about how it was looking today.  Reviewed photos that patient's mother sent over MyChart.  To the wound to the superior vertical limb incision, there appears to be some slough within the wound.  There does appear to be some mild surrounding irritation.  To the superior/lateral aspect of the NAC, there does appear to be some epidermolysis noted.  Discussed with the patient's mother that she may continue with Urgo clean to the wound to the superior vertical limb incision.  Recommended that she apply Vaseline throughout the remainder of the NAC.  Discussed with her that if she runs out of the Urgo clean, she may transition to the Vaseline daily.  Recommended they keep the area clean and dry.  Discussed with mother that patient should continue with her antibiotics.    Plan will be for patient to come to her scheduled appointment on Friday.  Discussed with the patient's mother that if there are any concerns in the meantime, they can call us .  She expressed understanding and was in agreement with the plan.

## 2024-02-09 NOTE — Patient Instructions (Signed)
 It was a pleasure to see you today!  Instructions for you until your next appointment are as follows: Remember that it is important for you to avoid skipping meals, to drink plenty of water each day and to get at least 8 hours of sleep each night as these things are known to reduce how often headaches occur.   Please sign up for MyChart if you have not done so. Please plan to return for follow up in 6 monts or sooner if needed.  Feel free to contact our office during normal business hours at [phone] with questions or concerns. If there is no answer or the call is outside business hours, please leave a message and our clinic staff will call you back within the next business day.  If you have an urgent concern, please stay on the line for our after-hours answering service and ask for the on-call neurologist.     I also encourage you to use MyChart to communicate with me more directly. If you have not yet signed up for MyChart within [facility], the front desk staff can help you. However, please note that this inbox is NOT monitored on nights or weekends, and response can take up to 2 business days.  Urgent matters should be discussed with the on-call pediatric neurologist.   At [facility], we are committed to providing exceptional care. You will receive a patient satisfaction survey through text or email regarding your visit today. Your opinion is important to me. Comments are appreciated.

## 2024-02-11 NOTE — Progress Notes (Signed)
 Patient is a 34 year old female who recently underwent bilateral breast reduction with Dr. Lowery on 01/20/2024.  Patient is a little over 3 weeks out from her procedure.  She presents to the clinic today for postoperative follow-up.     Patient was last seen in the clinic on 01/29/2024.  At this visit, it was noted that patient had a little bit of blue discoloration on the right nipple on the lateral aspect.  It was noted that this would probably slough and then heal secondarily.  Ergo dressings were given to the patient.  Since patient's last appointment, she was placed on antibiotics with concerns about the right side.  Today, patient presents with her mother at bedside.  Patient reports that she is overall doing well.  She states that she is still having pain to the medial and inferior aspects of her right breast.  She does not report any fevers or chills.  Patient and her mother deny any redness.  Patient's mother reports that patient finished antibiotic.  Patient and her mother state that they have been applying Vaseline and ergo dressings throughout the incisions.  Chaperone present on exam.  On exam, patient sitting upright in no acute distress.  Breasts are overall soft and symmetric.  There is no overlying erythema, no obvious fluid collections on exam.  Left NAC appears to be healthy.  To the right NAC, there is some sloughing noted to the right superior/lateral aspect of it.  Remainder of the NAC appears to be healthy.  There is a small wound to the superior vertical limb incision which appears to be improving.  There is also a little bit of irritation near the incision to the left vertical limb incision centrally.  Several Monocryl sutures were cut and removed.  Patient tolerated well.  There are no signs of infection on exam.  Recommended that patient and her mother continue to apply Vaseline over the NAC and to the incisions throughout daily.  Discussed with the patient's mother that the  NAC will continue to slough.  Patient and her mother expressed understanding.  In terms of pain, I discussed with the patient and her mother that there does not appear to be any sign of infection on exam.  Discussed with them that there is the possibility of nerve pain.  We did discuss different options including Toradol  and gabapentin.  Medical decision making was made with the patient and her mother and we will start with Toradol  and see how the patient does.  Patient's mother's states that the patient does not have any history of kidney disease, gastric ulcers or clotting diseases.  I discussed with the patient's mother that the patient cannot take any other NSAIDs while taking the Toradol .  I discussed with the patient's mother that the patient should only take the Toradol  if absolutely necessary.  Patient's mother expressed understanding.  Discussed with the patient and her mother for the patient to continue with compression at all times.  Patient to follow back up at her neck scheduled appointment.  Instructed them to call in the meantime with any questions or concerns.

## 2024-02-12 ENCOUNTER — Ambulatory Visit: Admitting: Student

## 2024-02-12 VITALS — BP 110/78 | HR 94

## 2024-02-12 DIAGNOSIS — N62 Hypertrophy of breast: Secondary | ICD-10-CM

## 2024-02-12 MED ORDER — KETOROLAC TROMETHAMINE 10 MG PO TABS: 10.0000 mg | ORAL_TABLET | Freq: Three times a day (TID) | ORAL | 0 refills | Status: AC | PRN

## 2024-02-18 ENCOUNTER — Encounter: Payer: Self-pay | Admitting: Plastic Surgery

## 2024-02-19 ENCOUNTER — Telehealth: Payer: Self-pay | Admitting: Student

## 2024-02-19 ENCOUNTER — Encounter: Payer: Self-pay | Admitting: Plastic Surgery

## 2024-02-19 NOTE — Telephone Encounter (Signed)
 PostOP: Bil reduction / Ins / Sx 01-20-24 [facility] 1200 Pts mom called and asked if CE could call her back, she has more concerning questions concerning the pts breast wound

## 2024-02-19 NOTE — Telephone Encounter (Signed)
 I called the patient's mother back again in regards to her MyChart message.  Discussed with her that the photos that she sent looks more like a reaction to me than a yeast infection.  Recommended that she stop using the Vaseline and apply the Benadryl  ointment or the hydrocortisone cream to the skin.  Recommended antihistamines.  Patient's mother expressed understanding.  Discussed with her to continue to monitor the area and to call back with any questions or concerns.

## 2024-02-19 NOTE — Telephone Encounter (Signed)
 I called the patient's mother in regards to her MyChart message.  Patient's mother states that patient has been experiencing itching.  She states that the patient only took 2 pills of the Toradol  and has not taken that in a while.  She states that she has only been taking Tylenol .  Patient's mother denies the patient having any difficulty breathing.  Patient's mother sent photos over MyChart.  Reviewed the photos.  Skin appears to be fairly dry.  It does appear to be irritated.  Recommended that patient's mother apply Benadryl  ointment over the areas that are itchy.  We did discuss hydrocortisone cream, I did discuss with her that this can inhibit wound healing and if she does decide to use hydrocortisone cream, she should only use this on the skin.  She expressed understanding.  I discussed with the patient's mother that the patient may also take an antihistamine during the day such as Allegra or Zyrtec and Benadryl  at night.  Patient's mother states that patient cannot take Benadryl  as patient becomes very emotional when taking Benadryl .  I discussed with patient's mother to start with the topical and the Allegra or Zyrtec.  Discussed with the patient's mother that if itching persists or worsens, we would have to consider an oral steroid.  Patient's mother expressed understanding.  I discussed with the patient's mother that if patient experiences any difficulty breathing or sensation that her throat is closing, she needs to go to the emergency room.  She expressed understanding.

## 2024-02-26 ENCOUNTER — Encounter: Admitting: Student

## 2024-08-03 ENCOUNTER — Ambulatory Visit (INDEPENDENT_AMBULATORY_CARE_PROVIDER_SITE_OTHER): Payer: Self-pay | Admitting: Family
